# Patient Record
Sex: Female | Born: 1950 | Race: Black or African American | Hispanic: No | Marital: Married | Smoking: Former smoker
Health system: Southern US, Community
[De-identification: ages and names within clinical notes are randomized; demographics above are authoritative.]

## PROBLEM LIST (undated history)

## (undated) DIAGNOSIS — I10 Essential (primary) hypertension: Secondary | ICD-10-CM

## (undated) DIAGNOSIS — Z923 Personal history of irradiation: Secondary | ICD-10-CM

## (undated) DIAGNOSIS — I2510 Atherosclerotic heart disease of native coronary artery without angina pectoris: Secondary | ICD-10-CM

## (undated) DIAGNOSIS — E785 Hyperlipidemia, unspecified: Secondary | ICD-10-CM

## (undated) DIAGNOSIS — C50919 Malignant neoplasm of unspecified site of unspecified female breast: Secondary | ICD-10-CM

## (undated) DIAGNOSIS — I219 Acute myocardial infarction, unspecified: Secondary | ICD-10-CM

## (undated) DIAGNOSIS — E119 Type 2 diabetes mellitus without complications: Secondary | ICD-10-CM

## (undated) HISTORY — DX: Atherosclerotic heart disease of native coronary artery without angina pectoris: I25.10

## (undated) HISTORY — PX: CORONARY ARTERY BYPASS GRAFT: SHX141

## (undated) HISTORY — PX: ABDOMINAL HYSTERECTOMY: SHX81

## (undated) HISTORY — DX: Hyperlipidemia, unspecified: E78.5

## (undated) HISTORY — PX: BREAST LUMPECTOMY: SHX2

---

## 2008-08-16 ENCOUNTER — Ambulatory Visit: Payer: Self-pay | Admitting: Family Medicine

## 2008-08-24 ENCOUNTER — Ambulatory Visit: Payer: Self-pay | Admitting: Internal Medicine

## 2008-08-26 ENCOUNTER — Ambulatory Visit: Payer: Self-pay | Admitting: Family Medicine

## 2008-08-31 ENCOUNTER — Ambulatory Visit: Payer: Self-pay | Admitting: Internal Medicine

## 2008-11-03 ENCOUNTER — Encounter: Payer: Self-pay | Admitting: Internal Medicine

## 2008-11-27 ENCOUNTER — Encounter: Payer: Self-pay | Admitting: Internal Medicine

## 2009-04-29 DIAGNOSIS — C50919 Malignant neoplasm of unspecified site of unspecified female breast: Secondary | ICD-10-CM

## 2009-04-29 DIAGNOSIS — Z923 Personal history of irradiation: Secondary | ICD-10-CM

## 2009-04-29 HISTORY — DX: Malignant neoplasm of unspecified site of unspecified female breast: C50.919

## 2009-04-29 HISTORY — DX: Personal history of irradiation: Z92.3

## 2009-04-29 HISTORY — PX: BREAST BIOPSY: SHX20

## 2009-10-11 ENCOUNTER — Ambulatory Visit: Payer: Self-pay | Admitting: Family Medicine

## 2009-12-15 ENCOUNTER — Ambulatory Visit: Payer: Self-pay | Admitting: Gastroenterology

## 2009-12-19 LAB — PATHOLOGY REPORT

## 2010-01-25 ENCOUNTER — Ambulatory Visit: Payer: Self-pay | Admitting: Emergency Medicine

## 2010-01-30 ENCOUNTER — Ambulatory Visit: Payer: Self-pay | Admitting: Emergency Medicine

## 2010-02-02 LAB — PATHOLOGY REPORT

## 2010-02-08 ENCOUNTER — Ambulatory Visit: Payer: Self-pay | Admitting: Emergency Medicine

## 2010-02-13 LAB — PATHOLOGY REPORT

## 2010-02-27 ENCOUNTER — Ambulatory Visit: Payer: Self-pay | Admitting: Radiation Oncology

## 2010-02-28 ENCOUNTER — Ambulatory Visit: Payer: Self-pay | Admitting: Radiation Oncology

## 2010-03-26 ENCOUNTER — Encounter: Payer: Self-pay | Admitting: Family Medicine

## 2010-03-29 ENCOUNTER — Ambulatory Visit: Payer: Self-pay | Admitting: Radiation Oncology

## 2010-03-29 ENCOUNTER — Encounter: Payer: Self-pay | Admitting: Family Medicine

## 2010-04-29 ENCOUNTER — Ambulatory Visit: Payer: Self-pay | Admitting: Radiation Oncology

## 2010-04-29 ENCOUNTER — Encounter: Payer: Self-pay | Admitting: Family Medicine

## 2010-05-30 ENCOUNTER — Ambulatory Visit: Payer: Self-pay | Admitting: Radiation Oncology

## 2010-06-28 ENCOUNTER — Ambulatory Visit: Payer: Self-pay | Admitting: Internal Medicine

## 2010-08-06 ENCOUNTER — Ambulatory Visit: Payer: Self-pay | Admitting: Internal Medicine

## 2010-08-08 ENCOUNTER — Ambulatory Visit: Payer: Self-pay | Admitting: Radiation Oncology

## 2010-08-22 ENCOUNTER — Ambulatory Visit: Payer: Self-pay | Admitting: Family Medicine

## 2010-08-28 ENCOUNTER — Ambulatory Visit: Payer: Self-pay | Admitting: Internal Medicine

## 2010-09-28 ENCOUNTER — Ambulatory Visit: Payer: Self-pay | Admitting: Internal Medicine

## 2011-04-04 ENCOUNTER — Ambulatory Visit: Payer: Self-pay | Admitting: Internal Medicine

## 2011-04-30 ENCOUNTER — Ambulatory Visit: Payer: Self-pay | Admitting: Internal Medicine

## 2011-07-22 IMAGING — CT CT SIM MISC
1 series · 16 of 33 positions shown, 20 images · non-contrast
Comparison: none

[Series 2: — · axial · 1.17mm/px · z∈[-934,-622]mm · 16 of 114 slices shown, 20 images]
[im 5/114  mediastinal]
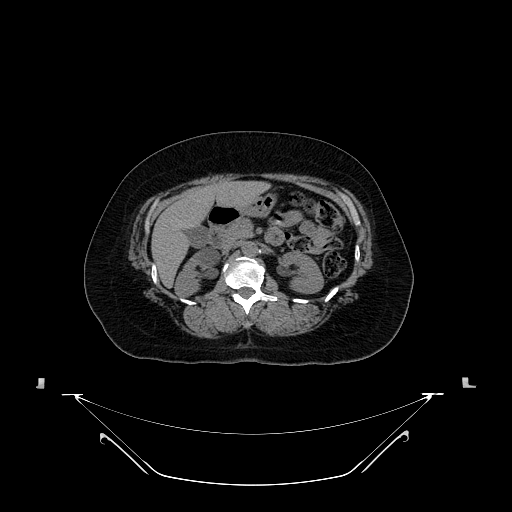
[im 5/114  lung]
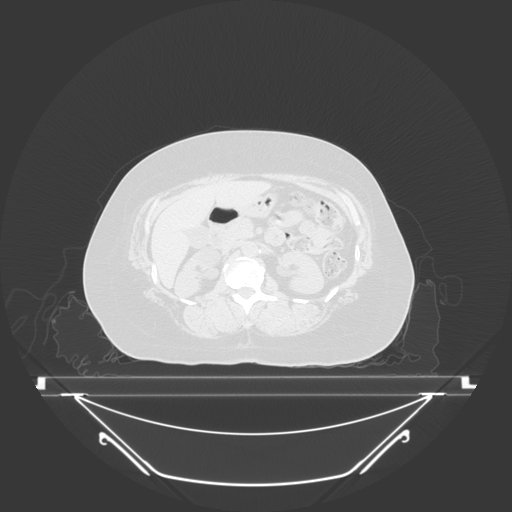
[im 13/114  lung]
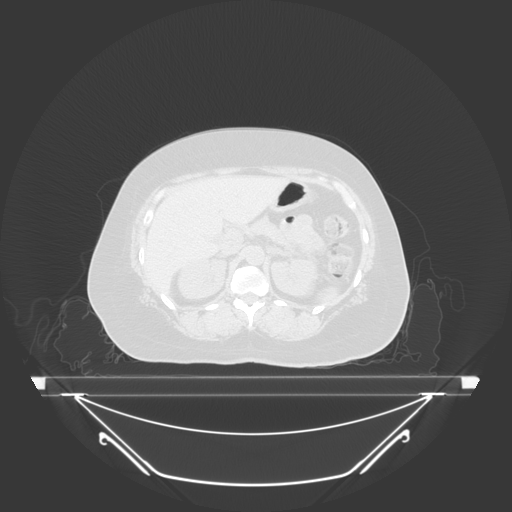
[im 21/114  lung]
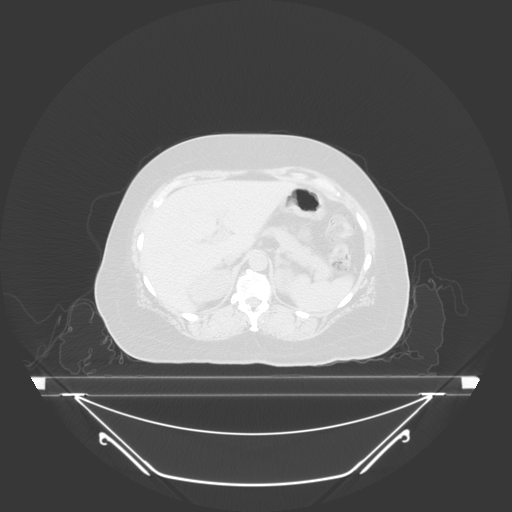
[im 26/114  lung]
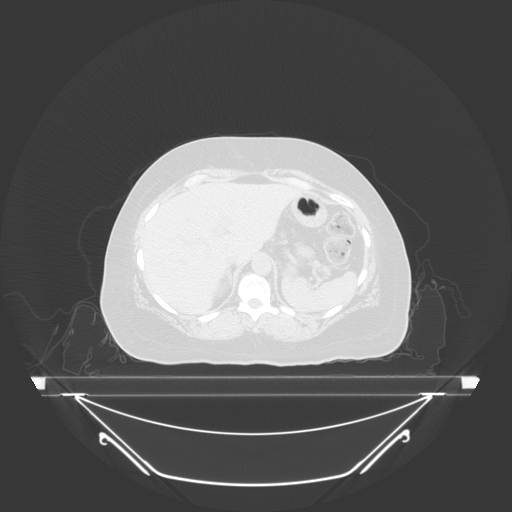
[im 34/114  mediastinal]
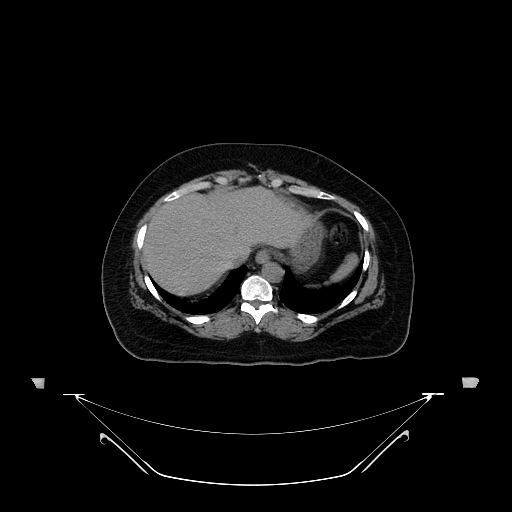
[im 34/114  lung]
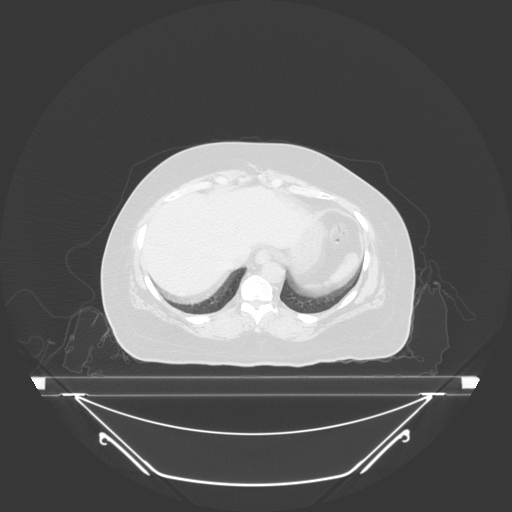
[im 42/114  lung]
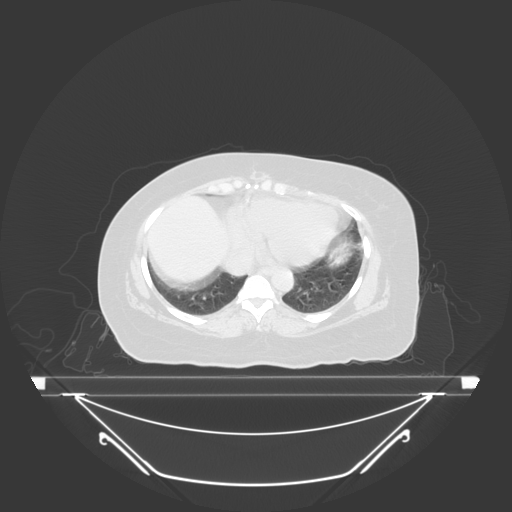
[im 47/114  lung]
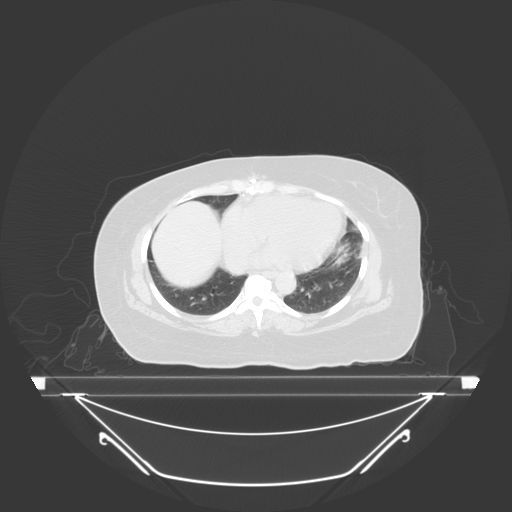
[im 55/114  lung]
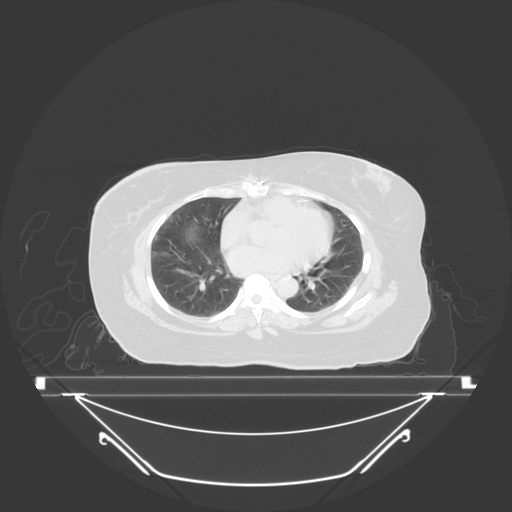
[im 63/114  mediastinal]
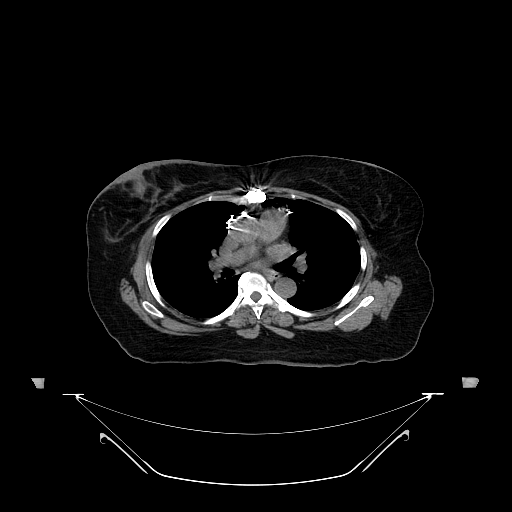
[im 63/114  lung]
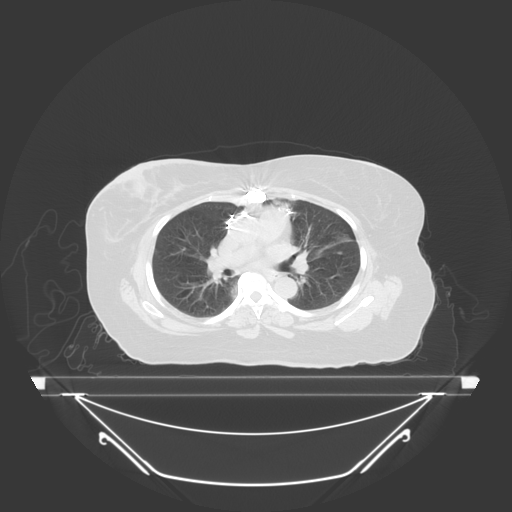
[im 67/114  lung]
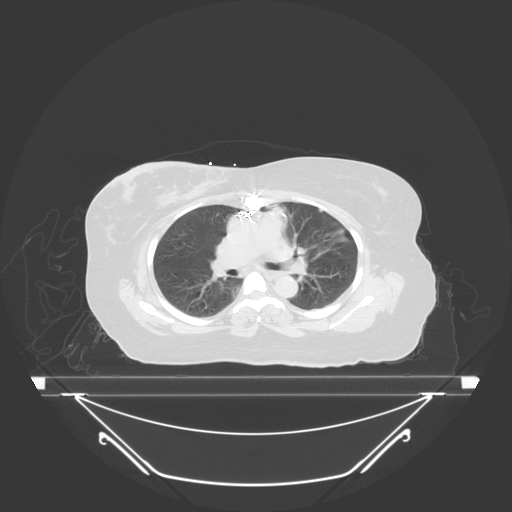
[im 72/114  lung]
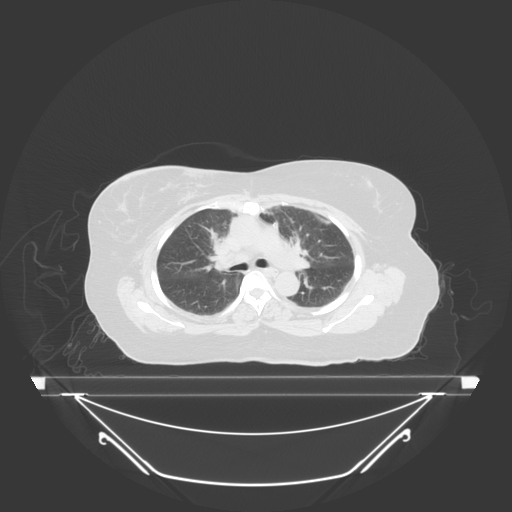
[im 80/114  lung]
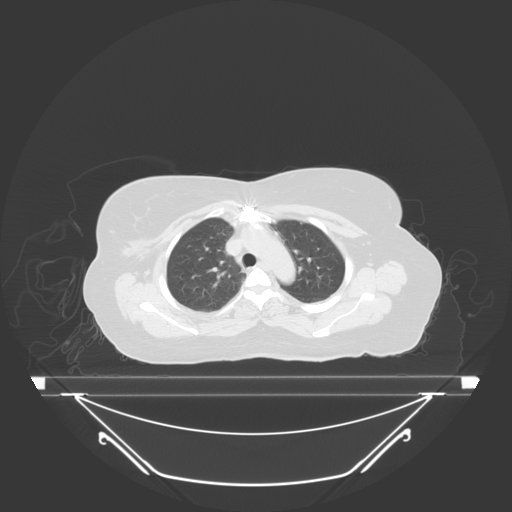
[im 88/114  mediastinal]
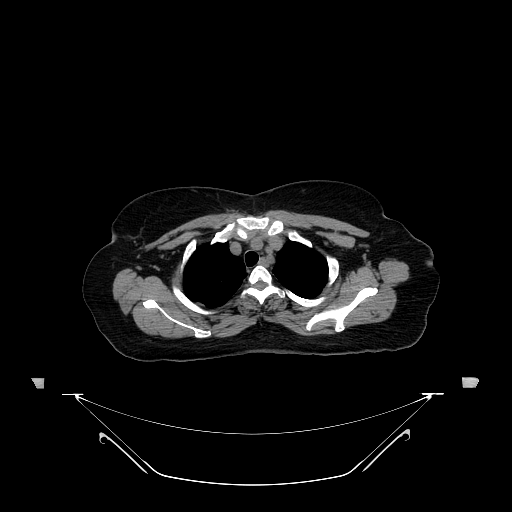
[im 88/114  lung]
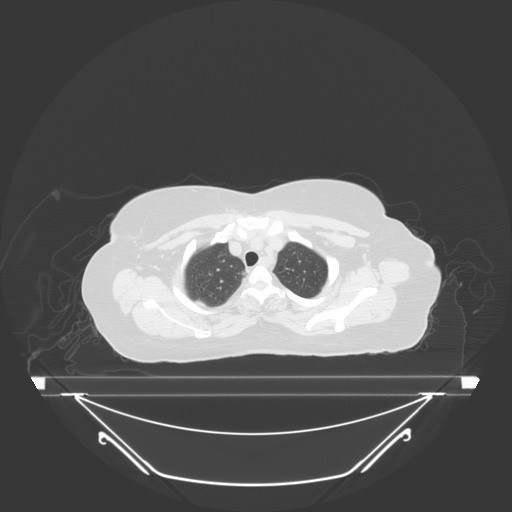
[im 93/114  lung]
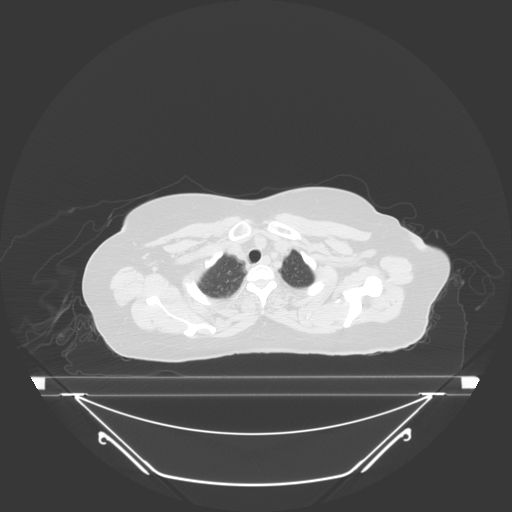
[im 101/114  lung]
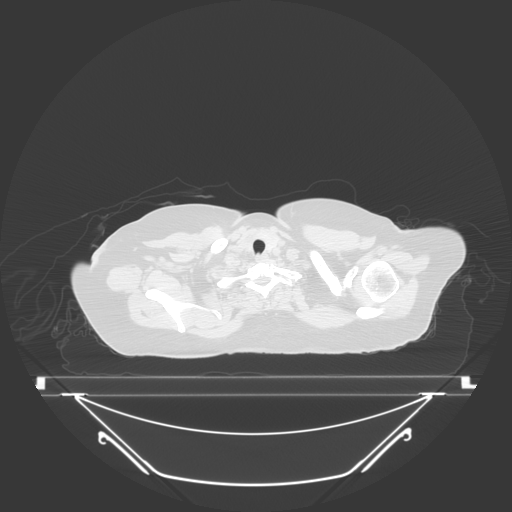
[im 109/114  lung]
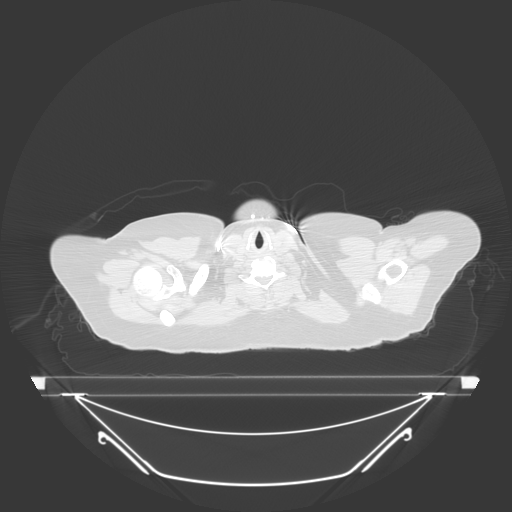

[16 of 33 positions shown; findings below may reference images not displayed]

IMAGES IMPORTED FROM THE [employer] WORKFLOW SYSTEM
NO DICTATION FOR STUDY

## 2011-08-12 ENCOUNTER — Ambulatory Visit: Payer: Self-pay | Admitting: Internal Medicine

## 2011-11-06 ENCOUNTER — Ambulatory Visit: Payer: Self-pay | Admitting: Internal Medicine

## 2011-11-06 LAB — HEPATIC FUNCTION PANEL A (ARMC)
Albumin: 4 g/dL (ref 3.4–5.0)
Alkaline Phosphatase: 92 U/L (ref 50–136)
Bilirubin,Total: 0.2 mg/dL (ref 0.2–1.0)
SGOT(AST): 15 U/L (ref 15–37)
SGPT (ALT): 18 U/L
Total Protein: 8 g/dL (ref 6.4–8.2)

## 2011-11-06 LAB — CBC CANCER CENTER
Basophil %: 0.6 %
Eosinophil #: 0.5 x10 3/mm (ref 0.0–0.7)
Eosinophil %: 8.1 %
HCT: 38.1 % (ref 35.0–47.0)
HGB: 12.2 g/dL (ref 12.0–16.0)
Lymphocyte #: 1.2 x10 3/mm (ref 1.0–3.6)
Lymphocyte %: 20 %
MCH: 29.5 pg (ref 26.0–34.0)
Monocyte #: 0.4 x10 3/mm (ref 0.2–0.9)
Monocyte %: 7.4 %
Neutrophil #: 3.9 x10 3/mm (ref 1.4–6.5)
Neutrophil %: 63.9 %
RBC: 4.14 10*6/uL (ref 3.80–5.20)

## 2011-11-06 LAB — CREATININE, SERUM
EGFR (African American): >60
EGFR (Non-African Amer.): >60

## 2011-11-28 ENCOUNTER — Ambulatory Visit: Payer: Self-pay | Admitting: Internal Medicine

## 2012-04-02 ENCOUNTER — Ambulatory Visit: Payer: Self-pay | Admitting: Radiation Oncology

## 2012-04-29 ENCOUNTER — Ambulatory Visit: Payer: Self-pay | Admitting: Radiation Oncology

## 2012-06-27 ENCOUNTER — Ambulatory Visit: Payer: Self-pay | Admitting: Internal Medicine

## 2012-07-06 LAB — CBC CANCER CENTER
Basophil %: 0.7 %
Eosinophil %: 7 %
HCT: 36.7 % (ref 35.0–47.0)
HGB: 12.3 g/dL (ref 12.0–16.0)
Lymphocyte #: 1.4 x10 3/mm (ref 1.0–3.6)
MCH: 29.7 pg (ref 26.0–34.0)
MCHC: 33.4 g/dL (ref 32.0–36.0)
MCV: 89 fL (ref 80–100)
Neutrophil #: 3.6 x10 3/mm (ref 1.4–6.5)
Platelet: 173 x10 3/mm (ref 150–440)
RBC: 4.13 10*6/uL (ref 3.80–5.20)
RDW: 14.7 % — ABNORMAL HIGH (ref 11.5–14.5)
WBC: 5.9 x10 3/mm (ref 3.6–11.0)

## 2012-07-06 LAB — HEPATIC FUNCTION PANEL A (ARMC)
Albumin: 3.9 g/dL (ref 3.4–5.0)
Bilirubin,Total: 0.2 mg/dL (ref 0.2–1.0)
SGOT(AST): 17 U/L (ref 15–37)
SGPT (ALT): 22 U/L (ref 12–78)

## 2012-07-06 LAB — CREATININE, SERUM
Creatinine: 0.76 mg/dL (ref 0.60–1.30)
EGFR (Non-African Amer.): >60

## 2012-07-28 ENCOUNTER — Ambulatory Visit: Payer: Self-pay | Admitting: Internal Medicine

## 2012-08-26 ENCOUNTER — Ambulatory Visit: Payer: Self-pay

## 2012-09-07 ENCOUNTER — Ambulatory Visit: Payer: Self-pay

## 2013-04-02 ENCOUNTER — Ambulatory Visit: Payer: Self-pay | Admitting: Internal Medicine

## 2013-04-29 ENCOUNTER — Ambulatory Visit: Payer: Self-pay | Admitting: Internal Medicine

## 2013-07-08 ENCOUNTER — Ambulatory Visit: Payer: Self-pay | Admitting: Internal Medicine

## 2013-07-09 LAB — LIPID PANEL
Cholesterol: 114 mg/dL (ref 0–200)
HDL: 43 mg/dL (ref 40–60)
LDL CHOLESTEROL, CALC: 49 mg/dL (ref 0–100)
Triglycerides: 109 mg/dL (ref 0–200)
VLDL CHOLESTEROL, CALC: 22 mg/dL (ref 5–40)

## 2013-07-09 LAB — CBC CANCER CENTER
Basophil #: 0 x10 3/mm (ref 0.0–0.1)
Basophil %: 0.7 %
EOS PCT: 3.9 %
Eosinophil #: 0.2 x10 3/mm (ref 0.0–0.7)
HCT: 38.7 % (ref 35.0–47.0)
HGB: 12.4 g/dL (ref 12.0–16.0)
LYMPHS ABS: 1.3 x10 3/mm (ref 1.0–3.6)
Lymphocyte %: 21.5 %
MCH: 28.6 pg (ref 26.0–34.0)
MCHC: 32.2 g/dL (ref 32.0–36.0)
MCV: 89 fL (ref 80–100)
MONO ABS: 0.4 x10 3/mm (ref 0.2–0.9)
Monocyte %: 7.4 %
Neutrophil #: 4 x10 3/mm (ref 1.4–6.5)
Neutrophil %: 66.5 %
PLATELETS: 203 x10 3/mm (ref 150–440)
RBC: 4.35 10*6/uL (ref 3.80–5.20)
RDW: 14.6 % — ABNORMAL HIGH (ref 11.5–14.5)
WBC: 6 x10 3/mm (ref 3.6–11.0)

## 2013-07-09 LAB — HEPATIC FUNCTION PANEL A (ARMC)
ALBUMIN: 4 g/dL (ref 3.4–5.0)
Alkaline Phosphatase: 65 U/L
BILIRUBIN TOTAL: 0.4 mg/dL (ref 0.2–1.0)
Bilirubin, Direct: 0.1 mg/dL (ref 0.00–0.20)
SGOT(AST): 20 U/L (ref 15–37)
SGPT (ALT): 21 U/L (ref 12–78)
Total Protein: 8 g/dL (ref 6.4–8.2)

## 2013-07-09 LAB — CREATININE, SERUM: CREATININE: 0.91 mg/dL (ref 0.60–1.30)

## 2013-07-28 ENCOUNTER — Ambulatory Visit: Payer: Self-pay | Admitting: Internal Medicine

## 2013-08-18 ENCOUNTER — Ambulatory Visit: Payer: Self-pay

## 2013-08-27 ENCOUNTER — Ambulatory Visit: Payer: Self-pay | Admitting: Internal Medicine

## 2013-09-30 ENCOUNTER — Ambulatory Visit: Payer: Self-pay | Admitting: Internal Medicine

## 2014-04-07 ENCOUNTER — Ambulatory Visit: Payer: Self-pay | Admitting: Internal Medicine

## 2014-04-29 ENCOUNTER — Ambulatory Visit: Payer: Self-pay | Admitting: Internal Medicine

## 2014-07-08 ENCOUNTER — Ambulatory Visit
Admit: 2014-07-08 | Disposition: A | Discharge status: Home / Self Care | Payer: Self-pay | Attending: Internal Medicine | Admitting: Internal Medicine

## 2014-07-29 ENCOUNTER — Ambulatory Visit
Admit: 2014-07-29 | Disposition: A | Discharge status: Home / Self Care | Payer: Self-pay | Attending: Internal Medicine | Admitting: Internal Medicine

## 2014-08-22 ENCOUNTER — Ambulatory Visit
Admit: 2014-08-22 | Disposition: A | Discharge status: Home / Self Care | Payer: Self-pay | Attending: Urgent Care | Admitting: Urgent Care

## 2015-04-14 ENCOUNTER — Encounter: Payer: Self-pay | Admitting: *Deleted

## 2015-04-14 ENCOUNTER — Ambulatory Visit
Admission: RE | Admit: 2015-04-14 | Discharge: 2015-04-14 | Disposition: A | Discharge status: Home / Self Care | Payer: Self-pay | Source: Ambulatory Visit | Attending: Radiation Oncology | Admitting: Radiation Oncology

## 2015-04-14 VITALS — BP 148/80 | HR 66 | Resp 20 | Ht 66.0 in | Wt 188.3 lb

## 2015-04-14 DIAGNOSIS — Z853 Personal history of malignant neoplasm of breast: Secondary | ICD-10-CM

## 2015-04-14 HISTORY — DX: Essential (primary) hypertension: I10

## 2015-04-14 HISTORY — DX: Malignant neoplasm of unspecified site of unspecified female breast: C50.919

## 2015-04-14 HISTORY — DX: Type 2 diabetes mellitus without complications: E11.9

## 2015-04-14 HISTORY — DX: Acute myocardial infarction, unspecified: I21.9

## 2015-04-14 NOTE — Progress Notes (Signed)
Radiation Oncology Follow up Note  Name: Molly Mosley   Date:   04/14/2015 MRN:  [id number] DOB: [birth year]    This 64 y.o. female presents to the clinic today for follow-up for breast cancer stage I (T1 CN 0) positive PR negative HER-2/neu not overexpressed status post whole breast radiation and wide local excision and sentinel node biopsy.  REFERRING PROVIDER: No ref. provider found  HPI: Patient is a 64 year old female now out 4 years having completed radiation therapy to her right breast status post wide local excision for a 1.2 cm invasive mammary carcinoma ER positive PR negative HER-2/neu negative. Seen today in routine follow-up she is doing well. She specifically denies breast tenderness cough or bone pain. Does occasionally has some tenderness in her lumpectomy site. She currently is on anastrozole tolerating that well without side effect. Follow-up mammograms have been benign last one being in March 2016.Marland Kitchen  COMPLICATIONS OF TREATMENT: none  FOLLOW UP COMPLIANCE: keeps appointments   PHYSICAL EXAM:  BP 148/80 mmHg  Pulse 66  Resp 20  Ht _0  (1.676 m)  Wt 188 lb 4.4 oz (85.4 kg)  BMI 30.40 kg/m2 Lungs are clear to A&P cardiac examination essentially unremarkable with regular rate and rhythm. No dominant mass or nodularity is noted in either breast in 2 positions examined. Incision is well-healed. No axillary or supraclavicular adenopathy is appreciated. Cosmetic result is excellent. Well-developed well-nourished patient in NAD. HEENT reveals PERLA, EOMI, discs not visualized.  Oral cavity is clear. No oral mucosal lesions are identified. Neck is clear without evidence of cervical or supraclavicular adenopathy. Lungs are clear to A&P. Cardiac examination is essentially unremarkable with regular rate and rhythm without murmur rub or thrill. Abdomen is benign with no organomegaly or masses noted. Motor sensory and DTR levels are equal and symmetric in the upper and lower extremities.  Cranial nerves II through XII are grossly intact. Proprioception is intact. No peripheral adenopathy or edema is identified. No motor or sensory levels are noted. Crude visual fields are within normal range.  RADIOLOGY RESULTS: Mammograms are reviewed  PLAN: At the present time she continues to do well with no evidence of disease. I'll see her back in 1 year for follow-up. Should she be doing well at that time will discontinue follow-up care. Patient knows to call sooner with any concerns. She continues on anti-estrogen therapy without side effect.  I would like to take this opportunity for allowing me to participate in the care of your patient.Armstead Peaks., MD

## 2015-07-11 ENCOUNTER — Other Ambulatory Visit: Payer: Self-pay | Admitting: *Deleted

## 2015-07-11 DIAGNOSIS — C50911 Malignant neoplasm of unspecified site of right female breast: Secondary | ICD-10-CM

## 2015-07-12 ENCOUNTER — Inpatient Hospital Stay: Payer: Self-pay | Attending: Internal Medicine | Admitting: Internal Medicine

## 2015-07-12 ENCOUNTER — Encounter: Payer: Self-pay | Admitting: Internal Medicine

## 2015-07-12 ENCOUNTER — Inpatient Hospital Stay: Payer: Self-pay

## 2015-07-12 VITALS — BP 143/78 | HR 88 | Temp 97.7°F | Resp 18 | Ht 66.0 in | Wt 184.5 lb

## 2015-07-12 DIAGNOSIS — I2510 Atherosclerotic heart disease of native coronary artery without angina pectoris: Secondary | ICD-10-CM | POA: Insufficient documentation

## 2015-07-12 DIAGNOSIS — Z170 Estrogen receptor positive status [ER+]: Secondary | ICD-10-CM | POA: Insufficient documentation

## 2015-07-12 DIAGNOSIS — C50911 Malignant neoplasm of unspecified site of right female breast: Secondary | ICD-10-CM

## 2015-07-12 DIAGNOSIS — E785 Hyperlipidemia, unspecified: Secondary | ICD-10-CM | POA: Insufficient documentation

## 2015-07-12 DIAGNOSIS — Z7982 Long term (current) use of aspirin: Secondary | ICD-10-CM | POA: Insufficient documentation

## 2015-07-12 DIAGNOSIS — Z79811 Long term (current) use of aromatase inhibitors: Secondary | ICD-10-CM | POA: Insufficient documentation

## 2015-07-12 DIAGNOSIS — Z951 Presence of aortocoronary bypass graft: Secondary | ICD-10-CM | POA: Insufficient documentation

## 2015-07-12 DIAGNOSIS — M8580 Other specified disorders of bone density and structure, unspecified site: Secondary | ICD-10-CM | POA: Insufficient documentation

## 2015-07-12 DIAGNOSIS — I252 Old myocardial infarction: Secondary | ICD-10-CM | POA: Insufficient documentation

## 2015-07-12 DIAGNOSIS — E119 Type 2 diabetes mellitus without complications: Secondary | ICD-10-CM | POA: Insufficient documentation

## 2015-07-12 DIAGNOSIS — Z87891 Personal history of nicotine dependence: Secondary | ICD-10-CM | POA: Insufficient documentation

## 2015-07-12 DIAGNOSIS — Z79899 Other long term (current) drug therapy: Secondary | ICD-10-CM | POA: Insufficient documentation

## 2015-07-12 DIAGNOSIS — Z7984 Long term (current) use of oral hypoglycemic drugs: Secondary | ICD-10-CM | POA: Insufficient documentation

## 2015-07-12 DIAGNOSIS — I10 Essential (primary) hypertension: Secondary | ICD-10-CM | POA: Insufficient documentation

## 2015-07-12 LAB — COMPREHENSIVE METABOLIC PANEL
ALBUMIN: 4.4 g/dL (ref 3.5–5.0)
ALT: 14 U/L (ref 14–54)
AST: 21 U/L (ref 15–41)
Alkaline Phosphatase: 52 U/L (ref 38–126)
Anion gap: 7 (ref 5–15)
BUN: 15 mg/dL (ref 6–20)
CHLORIDE: 105 mmol/L (ref 101–111)
CO2: 25 mmol/L (ref 22–32)
CREATININE: 0.71 mg/dL (ref 0.44–1.00)
Calcium: 9.3 mg/dL (ref 8.9–10.3)
GFR calc Af Amer: >60 mL/min (ref >60)
GLUCOSE: 113 mg/dL — AB (ref 65–99)
POTASSIUM: 3.9 mmol/L (ref 3.5–5.1)
Sodium: 137 mmol/L (ref 135–145)
Total Bilirubin: 0.4 mg/dL (ref 0.3–1.2)
Total Protein: 7.9 g/dL (ref 6.5–8.1)

## 2015-07-12 LAB — CBC WITH DIFFERENTIAL/PLATELET
BASOS ABS: 0 10*3/uL (ref 0–0.1)
BASOS PCT: 1 %
EOS PCT: 6 %
Eosinophils Absolute: 0.3 10*3/uL (ref 0–0.7)
HEMATOCRIT: 38.9 % (ref 35.0–47.0)
Hemoglobin: 13 g/dL (ref 12.0–16.0)
LYMPHS PCT: 25 %
Lymphs Abs: 1.3 10*3/uL (ref 1.0–3.6)
MCH: 29.5 pg (ref 26.0–34.0)
MCHC: 33.5 g/dL (ref 32.0–36.0)
MCV: 88 fL (ref 80.0–100.0)
Monocytes Absolute: 0.4 10*3/uL (ref 0.2–0.9)
Monocytes Relative: 7 %
NEUTROS ABS: 3.2 10*3/uL (ref 1.4–6.5)
Neutrophils Relative %: 61 %
PLATELETS: 159 10*3/uL (ref 150–440)
RBC: 4.42 MIL/uL (ref 3.80–5.20)
RDW: 13.9 % (ref 11.5–14.5)
WBC: 5.2 10*3/uL (ref 3.6–11.0)

## 2015-07-12 MED ORDER — ANASTROZOLE 1 MG PO TABS: 1.0000 mg | ORAL_TABLET | Freq: Every day | ORAL | Status: DC

## 2015-07-12 NOTE — Progress Notes (Signed)
Pt here and she should be on last year of AI-she started it 05/19/2010. She is tolerating med without any side effects. She does her own self breast exam and felt nothing abnl. Her last mammogram was 08/22/14 and will need one ordered for April of this year. No concerns today

## 2015-07-12 NOTE — Progress Notes (Signed)
[facility] OFFICE PROGRESS NOTE  Patient Care Team: Arlis Porta, FNP as PCP - General (Nurse Practitioner)   SUMMARY OF ONCOLOGIC HISTORY:  # RIGHT BREAST CANCER- STAGE I; ER-pos/PR neg; Her 2 Neg s/p Lumpec  S/p RT; Oncotype- 23/ intermediate risk [risk of recurrence 14%]-No adj chemo; Currently on Arimidex since January 2012; march 2017- cont Arimidex  # BMD- June 2015- osteopenia  INTERVAL HISTORY:  This is my first interaction with the patient since I joined the practice September 2016. I reviewed the patient's prior charts/pertinent labs/imaging in detail; findings are summarized above.   65 year female patient with above history of stage I breast cancer on Arimidex is here for follow-up. Patient denies any unusual bone pain. Denies any unusual hot flashes. Appetite is good. No shortness of breath or chest pain or cough. She is interested in coming off anastrozole at this time.   REVIEW OF SYSTEMS:  A complete 10 point review of system is done which is negative except mentioned above/history of present illness.     PAST MEDICAL HISTORY :  Past Medical History  Diagnosis Date  . MI (myocardial infarction) ([place])   . HTN (hypertension)   . Diabetes mellitus without complication ([place])   . Breast cancer ([place])     Right  . Hyperlipidemia   . CAD (coronary artery disease)     PAST SURGICAL HISTORY :   Past Surgical History  Procedure Laterality Date  . Abdominal hysterectomy    . Coronary artery bypass graft    . Breast lumpectomy Right     FAMILY HISTORY :   Family History  Problem Relation Age of Onset  . Diabetes Mother   . Hypertension Father     SOCIAL HISTORY:   Social History  Substance Use Topics  . Smoking status: Former Smoker    Quit date: 04/30/2011  . Smokeless tobacco: Never Used  . Alcohol Use: No    ALLERGIES:  has No Known Allergies.  MEDICATIONS:  Current Outpatient Prescriptions  Medication Sig Dispense Refill  .  alendronate (FOSAMAX) 70 MG tablet     . amLODipine (NORVASC) 5 MG tablet     . anastrozole (ARIMIDEX) 1 MG tablet     . aspirin EC 325 MG tablet Take by mouth.    . carvedilol (COREG) 12.5 MG tablet     . metFORMIN (GLUMETZA) 500 MG (MOD) 24 hr tablet Take by mouth.    . quinapril (ACCUPRIL) 40 MG tablet     . rosuvastatin (CRESTOR) 40 MG tablet Take by mouth.     No current facility-administered medications for this visit.    PHYSICAL EXAMINATION: ECOG PERFORMANCE STATUS: 0 - Asymptomatic  BP 143/78 mmHg  Pulse 88  Temp(Src) 97.7 F (36.5 C) (Tympanic)  Resp 18  Ht 5\' 6"  (1.676 m)  Wt 184 lb 8.4 oz (83.7 kg)  BMI 29.80 kg/m2  Filed Weights   07/12/15 1043  Weight: 184 lb 8.4 oz (83.7 kg)    GENERAL: Well-nourished well-developed; Alert, no distress and comfortable.   Alone. EYES: no pallor or icterus OROPHARYNX: no thrush or ulceration; good dentition  NECK: supple, no masses felt LYMPH:  no palpable lymphadenopathy in the cervical, axillary or inguinal regions LUNGS: clear to auscultation and  No wheeze or crackles HEART/CVS: regular rate & rhythm and no murmurs; No lower extremity edema ABDOMEN:abdomen soft, non-tender and normal bowel sounds Musculoskeletal:no cyanosis of digits and no clubbing  PSYCH: alert & oriented x 3  with fluent speech NEURO: no focal motor/sensory deficits SKIN:  no rashes or significant lesions Right and left BREAST exam [in the presence of nurse practioner ]- no unusual skin changes or dominant masses felt. Surgical scars noted.    LABORATORY DATA:  I have reviewed the data as listed    Component Value Date/Time   NA 137 07/12/2015 1016   K 3.9 07/12/2015 1016   CL 105 07/12/2015 1016   CO2 25 07/12/2015 1016   GLUCOSE 113* 07/12/2015 1016   BUN 15 07/12/2015 1016   CREATININE 0.71 07/12/2015 1016   CREATININE 0.91 07/09/2013 0951   CALCIUM 9.3 07/12/2015 1016   PROT 7.9 07/12/2015 1016   PROT 8.0 07/09/2013 0951   ALBUMIN 4.4  07/12/2015 1016   ALBUMIN 4.0 07/09/2013 0951   AST 21 07/12/2015 1016   AST 20 07/09/2013 0951   ALT 14 07/12/2015 1016   ALT 21 07/09/2013 0951   ALKPHOS 52 07/12/2015 1016   ALKPHOS 65 07/09/2013 0951   BILITOT 0.4 07/12/2015 1016   BILITOT 0.4 07/09/2013 0951   GFRNONAA >60 07/12/2015 1016   GFRNONAA >60 07/09/2013 0951   GFRAA >60 07/12/2015 1016   GFRAA >60 07/09/2013 0951    No results found for: SPEP, UPEP  Lab Results  Component Value Date   WBC 5.2 07/12/2015   NEUTROABS 3.2 07/12/2015   HGB 13.0 07/12/2015   HCT 38.9 07/12/2015   MCV 88.0 07/12/2015   PLT 159 07/12/2015      Chemistry      Component Value Date/Time   NA 137 07/12/2015 1016   K 3.9 07/12/2015 1016   CL 105 07/12/2015 1016   CO2 25 07/12/2015 1016   BUN 15 07/12/2015 1016   CREATININE 0.71 07/12/2015 1016   CREATININE 0.91 07/09/2013 0951      Component Value Date/Time   CALCIUM 9.3 07/12/2015 1016   ALKPHOS 52 07/12/2015 1016   ALKPHOS 65 07/09/2013 0951   AST 21 07/12/2015 1016   AST 20 07/09/2013 0951   ALT 14 07/12/2015 1016   ALT 21 07/09/2013 0951   BILITOT 0.4 07/12/2015 1016   BILITOT 0.4 07/09/2013 0951        ASSESSMENT & PLAN:   # BREAST CANCER- STAGE I; ER-pos/PR neg; Her 2 Neg' Intermediate risk on oncotype- no adjuvant chemo given.  Currently on Arimidex since January 2012. Clinically no evidence of recurrence noted.Recent mammogram in April 2016 within normal limits. I discussed the recent data of extended antihormone therapy to 10 years. Patient is interested in continuing beyond 5 years at this time especially as she is tolerating aromatase inhibitors well. I would also check of breast cancer index. New prescription for anastrozole sent to the pharmacy.  # Osteopenia bone density June 2015- patient Fosamax And vitamin D. We'll repeat a bone density in June 2017.  # Patient follow-up with me with CBC CMP one year.  Patient is due for mammogram again in April 2017.      # 25 minutes face-to-face with the patient discussing the above plan of care; more than 50% of time spent on prognosis/ natural history; counseling and coordination.  Cammie Sickle, MD 07/12/2015 11:23 AM

## 2015-08-23 ENCOUNTER — Other Ambulatory Visit: Payer: Self-pay | Admitting: Internal Medicine

## 2015-08-23 ENCOUNTER — Ambulatory Visit
Admission: RE | Admit: 2015-08-23 | Discharge: 2015-08-23 | Disposition: A | Discharge status: Home / Self Care | Payer: Self-pay | Source: Ambulatory Visit | Attending: Internal Medicine | Admitting: Internal Medicine

## 2015-08-23 DIAGNOSIS — Z853 Personal history of malignant neoplasm of breast: Secondary | ICD-10-CM | POA: Insufficient documentation

## 2015-08-23 DIAGNOSIS — C50911 Malignant neoplasm of unspecified site of right female breast: Secondary | ICD-10-CM

## 2015-08-23 DIAGNOSIS — Z13820 Encounter for screening for osteoporosis: Secondary | ICD-10-CM | POA: Insufficient documentation

## 2015-08-23 DIAGNOSIS — M8580 Other specified disorders of bone density and structure, unspecified site: Secondary | ICD-10-CM | POA: Insufficient documentation

## 2015-08-24 ENCOUNTER — Telehealth: Payer: Self-pay | Admitting: *Deleted

## 2015-08-24 NOTE — Telephone Encounter (Signed)
-----   Message from Cammie Sickle, MD sent at 08/24/2015  8:45 AM EDT ----- Please inform pt that her BMD shows osteopenia- similar to previous BMD- continue ca + vitD + fosomax. However, if she is interested further, she can come to office to see me; and I can talk to her re: reclast/prolia q 6 months- Thx

## 2015-08-24 NOTE — Telephone Encounter (Signed)
Left msg asking pt to contact cancer center for bmd results

## 2015-08-29 ENCOUNTER — Telehealth: Payer: Self-pay | Admitting: *Deleted

## 2015-08-29 NOTE — Telephone Encounter (Signed)
ASSESSMENT: The BMD measured at Femur Neck Left is 0.848 g/cm2 with a T-score of -1.4. This patient is considered osteopenic according to [facility]) criteria. Lumbar spine was not utilized due to advanced degenerative changes.  Site Region Measured Measured WHO Young Adult BMD Date Age Classification T-score DualFemur Neck Left 08/23/2015 65.0 Oste[age] -1.4 0.848 g/cm2 DualFemur Neck Left 09/30/2013 62.1 Osteopenia -1.2 0.873 g/cm2  Left Forearm Radius 33% 08/23/2015 64.0 Normal 0.0 0.879 g/cm2 Left Forearm Radius 33% 09/30/2013 62.1 Normal 0.2 0.889 g/cm2  World Health Organization [facility]) criteria for post-menopausal, Caucasian Women: Normal: T-score at or above -1 SD Osteopenia: T-score between -1 and -2.5 SD Osteoporosis: T-score at or below -2.5 SD  RECOMMENDATIONS: [facility] recommends that FDA-approved medical therapies be considered in postmenopausal women and men age 22 or older with a: 1. Hip or vertebral (clinical or morphometric) fracture. 2. T-score of < -2.5 at the spine or hip. 3. Ten-year fracture probability by FRAX of 3% or greater for hip fracture or 20% or greater for major osteoporotic fracture.  All treatment decisions require clinical judgment and consideration of individual patient factors, including patient preferences, co-morbidities, previous drug use, risk factors not captured in the FRAX model (e.g. falls, vitamin D deficiency, increased bone turnover, interval significant decline in bone density) and possible under - or over-estimation of fracture risk by FRAX.  All patients should ensure an adequate intake of dietary calcium (1200 mg/d) and vitamin D (800 IU daily) unless contraindicated.

## 2015-08-30 NOTE — Telephone Encounter (Signed)
I attempted to return call to patient and left VM to call me back   Per Dr B on testing report, "Please inform pt that her BMD shows osteopenia- similar to previous BMD- continue ca + vitD + fosomax. However, if she is interested further, she can come to office to see me; and I can talk to her re: reclast/prolia q 6 months- Thx"

## 2016-02-16 ENCOUNTER — Encounter: Payer: Self-pay | Admitting: *Deleted

## 2016-04-12 ENCOUNTER — Ambulatory Visit: Payer: Self-pay | Admitting: Radiation Oncology

## 2016-04-23 ENCOUNTER — Ambulatory Visit: Payer: Self-pay | Admitting: Radiation Oncology

## 2016-05-10 ENCOUNTER — Encounter: Payer: Self-pay | Admitting: *Deleted

## 2016-05-20 ENCOUNTER — Ambulatory Visit: Payer: Self-pay | Admitting: Radiation Oncology

## 2016-06-18 ENCOUNTER — Emergency Department
Admission: EM | Admit: 2016-06-18 | Discharge: 2016-06-18 | Disposition: A | Discharge status: Home / Self Care | Payer: Self-pay | Attending: Emergency Medicine | Admitting: Emergency Medicine

## 2016-06-18 ENCOUNTER — Emergency Department: Payer: Self-pay

## 2016-06-18 DIAGNOSIS — Z853 Personal history of malignant neoplasm of breast: Secondary | ICD-10-CM | POA: Insufficient documentation

## 2016-06-18 DIAGNOSIS — E119 Type 2 diabetes mellitus without complications: Secondary | ICD-10-CM | POA: Insufficient documentation

## 2016-06-18 DIAGNOSIS — I10 Essential (primary) hypertension: Secondary | ICD-10-CM | POA: Insufficient documentation

## 2016-06-18 DIAGNOSIS — R002 Palpitations: Secondary | ICD-10-CM | POA: Insufficient documentation

## 2016-06-18 DIAGNOSIS — Z87891 Personal history of nicotine dependence: Secondary | ICD-10-CM | POA: Insufficient documentation

## 2016-06-18 DIAGNOSIS — Z79899 Other long term (current) drug therapy: Secondary | ICD-10-CM | POA: Insufficient documentation

## 2016-06-18 DIAGNOSIS — Z7984 Long term (current) use of oral hypoglycemic drugs: Secondary | ICD-10-CM | POA: Insufficient documentation

## 2016-06-18 DIAGNOSIS — Z7982 Long term (current) use of aspirin: Secondary | ICD-10-CM | POA: Insufficient documentation

## 2016-06-18 DIAGNOSIS — J069 Acute upper respiratory infection, unspecified: Secondary | ICD-10-CM | POA: Insufficient documentation

## 2016-06-18 DIAGNOSIS — I252 Old myocardial infarction: Secondary | ICD-10-CM | POA: Insufficient documentation

## 2016-06-18 DIAGNOSIS — I2510 Atherosclerotic heart disease of native coronary artery without angina pectoris: Secondary | ICD-10-CM | POA: Insufficient documentation

## 2016-06-18 LAB — CBC
HCT: 38.2 % (ref 35.0–47.0)
HEMOGLOBIN: 13.2 g/dL (ref 12.0–16.0)
MCH: 30.3 pg (ref 26.0–34.0)
MCHC: 34.6 g/dL (ref 32.0–36.0)
MCV: 87.6 fL (ref 80.0–100.0)
PLATELETS: 176 10*3/uL (ref 150–440)
RBC: 4.36 MIL/uL (ref 3.80–5.20)
RDW: 13.9 % (ref 11.5–14.5)
WBC: 7.3 10*3/uL (ref 3.6–11.0)

## 2016-06-18 LAB — BASIC METABOLIC PANEL
ANION GAP: 9 (ref 5–15)
BUN: 13 mg/dL (ref 6–20)
CALCIUM: 10 mg/dL (ref 8.9–10.3)
CHLORIDE: 103 mmol/L (ref 101–111)
CO2: 28 mmol/L (ref 22–32)
CREATININE: 1.16 mg/dL — AB (ref 0.44–1.00)
GFR calc non Af Amer: 49 mL/min — ABNORMAL LOW (ref >60)
GFR, EST AFRICAN AMERICAN: 56 mL/min — AB (ref >60)
Glucose, Bld: 140 mg/dL — ABNORMAL HIGH (ref 65–99)
Potassium: 3.4 mmol/L — ABNORMAL LOW (ref 3.5–5.1)
SODIUM: 140 mmol/L (ref 135–145)

## 2016-06-18 LAB — TROPONIN I

## 2016-06-18 NOTE — ED Triage Notes (Signed)
Pt states she was on tamiflu for 2.5 days and on Sunday had some heart palpitations and just didn't feel well so she stopped taking the tamiflu and states the cough and congestion is not getting any better and is having some tightness in her chest still.

## 2016-06-18 NOTE — ED Provider Notes (Signed)
[facility] Emergency Department Provider Note  Time seen: 7:14 PM  I have reviewed the triage vital signs and the nursing notes.   HISTORY  Chief Complaint Palpitations    HPI Anycia Kaliszewski is a 66 y.o. female with a past medical history of CAD, diabetes, hypertension, MI status post CABG, presents to the emergency department with cough, congestion and palpitations. According to the patient proximal neck 5 days ago she developed cough and congestion. She talked to her doctor who diagnosed her with influenza clinically and started her on Tamiflu. Patient states after 2-1/2 days of Tamiflu she began having palpitations where she was feeling like her heart was skipping beats so she stopped taking it. Denies any chest pain or discomfort. States continued cough with a states the cough has decreased in severity. Patient states she continues to have congestion and feeling weak so she came to the department for evaluation.  Past Medical History:  Diagnosis Date  . Breast cancer [facility]) 2011   Right- radiation  . CAD (coronary artery disease)   . Diabetes mellitus without complication ([place])   . HTN (hypertension)   . Hyperlipidemia   . MI (myocardial infarction)     There are no active problems to display for this patient.   Past Surgical History:  Procedure Laterality Date  . ABDOMINAL HYSTERECTOMY    . BREAST BIOPSY Right 2011   Positive  . BREAST LUMPECTOMY Right   . CORONARY ARTERY BYPASS GRAFT      Prior to Admission medications   Medication Sig Start Date End Date Taking? Authorizing Provider  alendronate (FOSAMAX) 70 MG tablet  04/10/15   Historical Provider, MD  amLODipine (NORVASC) 5 MG tablet  03/08/15   Historical Provider, MD  anastrozole (ARIMIDEX) 1 MG tablet Take 1 tablet (1 mg total) by mouth daily. 07/12/15   Cammie Sickle, MD  aspirin EC 325 MG tablet Take by mouth.    Historical Provider, MD  carvedilol (COREG) 12.5 MG tablet  02/01/15    Historical Provider, MD  metFORMIN (GLUMETZA) 500 MG (MOD) 24 hr tablet Take by mouth.    Historical Provider, MD  quinapril (ACCUPRIL) 40 MG tablet  02/01/15   Historical Provider, MD  rosuvastatin (CRESTOR) 40 MG tablet Take by mouth. 02/02/15   Historical Provider, MD    No Known Allergies  Family History  Problem Relation Age of Onset  . Diabetes Mother   . Hypertension Father   . Breast cancer Neg Hx     Social History Social History  Substance Use Topics  . Smoking status: Former Smoker    Quit date: 04/30/2011  . Smokeless tobacco: Never Used  . Alcohol use No    Review of Systems Constitutional: Negative for fever. Cardiovascular: Negative for chest pain.Positive for palpitations, now resolved. Respiratory: Negative for shortness of breath. Positive for cough Gastrointestinal: Negative for abdominal pain Neurological: Negative for headache 10-point ROS otherwise negative.  ____________________________________________   PHYSICAL EXAM:  VITAL SIGNS: ED Triage Vitals  Enc Vitals Group     BP 06/18/16 1830 127/63     Pulse Rate 06/18/16 1830 (!) 55     Resp 06/18/16 1830 18     Temp 06/18/16 1830 99.1 F (37.3 C)     Temp Source 06/18/16 1830 Oral     SpO2 06/18/16 1830 98 %     Weight 06/18/16 1831 174 lb (78.9 kg)     Height 06/18/16 1831 5\' 6"  (1.676 m)  Head Circumference --      Peak Flow --      Pain Score 06/18/16 1831 0     Pain Loc --      Pain Edu? --      Excl. in [place]? --     Constitutional: Alert and oriented. Well appearing and in no distress. Eyes: Normal exam ENT   Head: Normocephalic and atraumatic.   Mouth/Throat: Mucous membranes are moist. Cardiovascular: Normal rate, regular rhythm. No murmur Respiratory: Normal respiratory effort without tachypnea nor retractions. Mild rhonchi in the right lung field. Gastrointestinal: Soft and nontender. No distention.  Musculoskeletal: Nontender with normal range of motion in all  extremities.  Neurologic:  Normal speech and language. No gross focal neurologic deficits. Skin:  Skin is warm, dry and intact.  Psychiatric: Mood and affect are normal.   ____________________________________________    EKG  EKG reviewed and interpreted by myself appears to show sinus bradycardia versus a defibrillation at 55 bpm, narrow QRS, left axis deviation, nonspecific but no concerning ST changes.  Repeat EKG reviewed and interpreted by myself 19:20:57 shows sinus bradycardia with clear P waves at 55 bpm. Narrow QRS with left axis deviation with nonspecific but no concerning ST changes.  ____________________________________________    RADIOLOGY  Chest x-ray shows mid left lung atelectasis otherwise negative  ____________________________________________   INITIAL IMPRESSION / ASSESSMENT AND PLAN / ED COURSE  Pertinent labs & imaging results that were available during my care of the patient were reviewed by me and considered in my medical decision making (see chart for details).  Patient presents to the emergency department with continued cough and congestion after being diagnosed with influenza 4 days ago. Patient is no longer taking Tamiflu as she states he was giving her palpitations 2 days ago. Denies any current palpitations but continues to have cough and congestion and generalized fatigue so she came to the emergency department for evaluation. Patient's chest x-ray is negative, labs are largely within normal limits. Troponin is negative. Patient's EKG shows bradycardia which she has been seen by Dr. Nehemiah Massed for in the past however there is no clear P waves. We will repeat an EKG to ensure sinus bradycardia versus atrial fibrillation.  Labs are within normal limits. Troponin negative. Chest x-rays negative. Repeat EKG clearly shows sinus bradycardia. Patient will be discharged home with PCP follow-up. She is agreeable to this  plan.  ____________________________________________   FINAL CLINICAL IMPRESSION(S) / ED DIAGNOSES  Palpitations Upper respiratory infection    Harvest Dark, MD 06/18/16 [phone]

## 2016-07-09 ENCOUNTER — Other Ambulatory Visit: Payer: Self-pay | Admitting: *Deleted

## 2016-07-09 DIAGNOSIS — Z853 Personal history of malignant neoplasm of breast: Secondary | ICD-10-CM

## 2016-07-11 ENCOUNTER — Encounter: Payer: Self-pay | Admitting: Radiation Oncology

## 2016-07-11 ENCOUNTER — Inpatient Hospital Stay (HOSPITAL_BASED_OUTPATIENT_CLINIC_OR_DEPARTMENT_OTHER): Payer: Medicare Other | Admitting: Internal Medicine

## 2016-07-11 ENCOUNTER — Inpatient Hospital Stay: Payer: Medicare Other | Attending: Internal Medicine

## 2016-07-11 ENCOUNTER — Ambulatory Visit
Admission: RE | Admit: 2016-07-11 | Discharge: 2016-07-11 | Disposition: A | Discharge status: Home / Self Care | Payer: Medicare Other | Source: Ambulatory Visit | Attending: Radiation Oncology | Admitting: Radiation Oncology

## 2016-07-11 VITALS — BP 144/81 | HR 65 | Temp 98.9°F | Resp 20 | Wt 177.8 lb

## 2016-07-11 DIAGNOSIS — C50811 Malignant neoplasm of overlapping sites of right female breast: Secondary | ICD-10-CM | POA: Insufficient documentation

## 2016-07-11 DIAGNOSIS — Z79811 Long term (current) use of aromatase inhibitors: Secondary | ICD-10-CM | POA: Insufficient documentation

## 2016-07-11 DIAGNOSIS — I10 Essential (primary) hypertension: Secondary | ICD-10-CM

## 2016-07-11 DIAGNOSIS — I252 Old myocardial infarction: Secondary | ICD-10-CM

## 2016-07-11 DIAGNOSIS — R232 Flushing: Secondary | ICD-10-CM

## 2016-07-11 DIAGNOSIS — M8580 Other specified disorders of bone density and structure, unspecified site: Secondary | ICD-10-CM

## 2016-07-11 DIAGNOSIS — E785 Hyperlipidemia, unspecified: Secondary | ICD-10-CM | POA: Insufficient documentation

## 2016-07-11 DIAGNOSIS — I2510 Atherosclerotic heart disease of native coronary artery without angina pectoris: Secondary | ICD-10-CM

## 2016-07-11 DIAGNOSIS — Z170 Estrogen receptor positive status [ER+]: Secondary | ICD-10-CM | POA: Insufficient documentation

## 2016-07-11 DIAGNOSIS — Z87891 Personal history of nicotine dependence: Secondary | ICD-10-CM | POA: Insufficient documentation

## 2016-07-11 DIAGNOSIS — Z79899 Other long term (current) drug therapy: Secondary | ICD-10-CM | POA: Insufficient documentation

## 2016-07-11 DIAGNOSIS — Z7982 Long term (current) use of aspirin: Secondary | ICD-10-CM | POA: Insufficient documentation

## 2016-07-11 DIAGNOSIS — Z923 Personal history of irradiation: Secondary | ICD-10-CM | POA: Insufficient documentation

## 2016-07-11 DIAGNOSIS — Z853 Personal history of malignant neoplasm of breast: Secondary | ICD-10-CM

## 2016-07-11 DIAGNOSIS — E119 Type 2 diabetes mellitus without complications: Secondary | ICD-10-CM | POA: Insufficient documentation

## 2016-07-11 DIAGNOSIS — C50911 Malignant neoplasm of unspecified site of right female breast: Secondary | ICD-10-CM | POA: Insufficient documentation

## 2016-07-11 LAB — COMPREHENSIVE METABOLIC PANEL
ALT: 13 U/L — ABNORMAL LOW (ref 14–54)
AST: 23 U/L (ref 15–41)
Albumin: 4.3 g/dL (ref 3.5–5.0)
Alkaline Phosphatase: 51 U/L (ref 38–126)
Anion gap: 7 (ref 5–15)
BILIRUBIN TOTAL: 0.5 mg/dL (ref 0.3–1.2)
BUN: 12 mg/dL (ref 6–20)
CO2: 27 mmol/L (ref 22–32)
CREATININE: 0.76 mg/dL (ref 0.44–1.00)
Calcium: 9.7 mg/dL (ref 8.9–10.3)
Chloride: 106 mmol/L (ref 101–111)
Glucose, Bld: 130 mg/dL — ABNORMAL HIGH (ref 65–99)
POTASSIUM: 3.7 mmol/L (ref 3.5–5.1)
Sodium: 140 mmol/L (ref 135–145)
TOTAL PROTEIN: 8.1 g/dL (ref 6.5–8.1)

## 2016-07-11 LAB — CBC WITH DIFFERENTIAL/PLATELET
BASOS ABS: 0 10*3/uL (ref 0–0.1)
Basophils Relative: 1 %
EOS PCT: 10 %
Eosinophils Absolute: 0.5 10*3/uL (ref 0–0.7)
HEMATOCRIT: 37.8 % (ref 35.0–47.0)
Hemoglobin: 12.7 g/dL (ref 12.0–16.0)
LYMPHS ABS: 1.3 10*3/uL (ref 1.0–3.6)
LYMPHS PCT: 25 %
MCH: 29.7 pg (ref 26.0–34.0)
MCHC: 33.7 g/dL (ref 32.0–36.0)
MCV: 88.4 fL (ref 80.0–100.0)
MONO ABS: 0.4 10*3/uL (ref 0.2–0.9)
MONOS PCT: 8 %
Neutro Abs: 3 10*3/uL (ref 1.4–6.5)
Neutrophils Relative %: 56 %
PLATELETS: 178 10*3/uL (ref 150–440)
RBC: 4.28 MIL/uL (ref 3.80–5.20)
RDW: 14.1 % (ref 11.5–14.5)
WBC: 5.3 10*3/uL (ref 3.6–11.0)

## 2016-07-11 MED ORDER — ANASTROZOLE 1 MG PO TABS: 1.0000 mg | ORAL_TABLET | Freq: Every day | ORAL | 5 refills | Status: DC

## 2016-07-11 NOTE — Progress Notes (Signed)
Patient here for breast cancer follow-up. She has no medical complaints. Patient assessment/vitals performed in radiation dept. Patient currently on Arimidex. She would like to know when md will take her off the Arimidex. She has been on the medication for approx. 6 years.  Pt request RF on Arimidex should md would like her to be on medication for a longer period of time.

## 2016-07-11 NOTE — Assessment & Plan Note (Addendum)
#   BREAST CANCER- STAGE I; ER-pos/PR neg; Her 2 Neg' Intermediate risk on oncotype- no adjuvant chemo given.  Currently on Arimidex since January 2012. Clinically no evidence of recurrence noted.Recent mammogram in April 2016 within normal limits.   # I again reviewed the recent data of extended antihormone therapy to 10 years. Patient is interested in continuing beyond 5 years at this time especially as she is tolerating aromatase inhibitors well.  I would also check of breast cancer index- if benefit is low of extended therapy- I would recommend discontinuation of AI therapy.Pt is in agreement of the plan. New prescription for anastrozole sent to the pharmacy.  # Osteopenia bone density June 2015- patient Fosamax And vitamin D. April 2017- osteopenia..  # Hot flashes- G-1-2.   # Patient follow-up with me with CBC CMP one year.  Patient is due for mammogram again in April 2018. Will call re: results of [facility]CI- and with recommendations re: continuation of arimidex.

## 2016-07-11 NOTE — Progress Notes (Signed)
Radiation Oncology Follow up Note  Name: Molly Mosley   Date:   07/11/2016 MRN:  [id number] DOB: 1951-04-25    This 66 y.o. female presents to the clinic today for 5 year follow-up status post whole breast radiation for stage I ER positive PR negative invasive mammary carcinoma to her right breast status post wide local excision.  REFERRING PROVIDER: Arlis Porta, FNP  HPI: Patient is a 66 year old female now out 5 years having completed whole breast radiation to her right breast for 1.2 cm invasive mammary carcinoma ER positive PR negative HER-2/neu not overexpressed. Seen today in routine follow she continues to do well. She specifically denies breast tenderness cough or bone pain.. She is currently on room and asked time that well without side effect. Her last mammograms back in April 2017 were BI-RADS 2 benign she scheduled for repeat scans in April.  COMPLICATIONS OF TREATMENT: none  FOLLOW UP COMPLIANCE: keeps appointments   PHYSICAL EXAM:  BP (!) 144/81   Pulse 65   Temp 98.9 F (37.2 C)   Resp 20   Wt 177 lb 12.8 oz (80.7 kg)   BMI 28.70 kg/m  Lungs are clear to A&P cardiac examination essentially unremarkable with regular rate and rhythm. No dominant mass or nodularity is noted in either breast in 2 positions examined. Incision is well-healed. No axillary or supraclavicular adenopathy is appreciated. Cosmetic result is excellent. Well-developed well-nourished patient in NAD. HEENT reveals PERLA, EOMI, discs not visualized.  Oral cavity is clear. No oral mucosal lesions are identified. Neck is clear without evidence of cervical or supraclavicular adenopathy. Lungs are clear to A&P. Cardiac examination is essentially unremarkable with regular rate and rhythm without murmur rub or thrill. Abdomen is benign with no organomegaly or masses noted. Motor sensory and DTR levels are equal and symmetric in the upper and lower extremities. Cranial nerves II through XII are grossly intact.  Proprioception is intact. No peripheral adenopathy or edema is identified. No motor or sensory levels are noted. Crude visual fields are within normal range.  RADIOLOGY RESULTS: Previous mammograms are reviewed and compatible with the above-stated findings  PLAN: Present time she is now 5 years out doing well with no evidence of disease. I'm please were overall progress. I am going to discontinue follow-up care at this point. She continues on aromatase. She continues follow-up care with medical oncology. Would be happy to reevaluate the patient any time for any issues.  I would like to take this opportunity to thank you for allowing me to participate in the care of your patient.Armstead Peaks., MD

## 2016-07-11 NOTE — Progress Notes (Signed)
[place] OFFICE PROGRESS NOTE  Patient Care Team: Frazier Richards, MD as PCP - General (Family Medicine)   SUMMARY OF ONCOLOGIC HISTORY: Oncology History   # RIGHT BREAST CANCER- STAGE I; ER-pos/PR neg; Her 2 Neg s/p Lumpec  S/p RT; Oncotype- 23/ intermediate risk [risk of recurrence 14%]-No adj chemo; Currently on Arimidex since January 2012; march 2017- cont Arimidex  # BMD- June 2015- osteopenia     Carcinoma of overlapping sites of right breast in female, estrogen receptor positive ([place])     INTERVAL HISTORY:  66 year female patient with above history of stage I breast cancer on Arimidex is here for follow-up. Patient denies any unusual bone pain. Patient has mild to moderate hot flashes. Appetite is good. No shortness of breath or chest pain or cough. Otherwise she is tolerating anastrozole fairly well.  REVIEW OF SYSTEMS:  A complete 10 point review of system is done which is negative except mentioned above/history of present illness.     PAST MEDICAL HISTORY :  Past Medical History:  Diagnosis Date  . Breast cancer [facility]) 2011   Right- radiation  . CAD (coronary artery disease)   . Diabetes mellitus without complication ([place])   . HTN (hypertension)   . Hyperlipidemia   . MI (myocardial infarction)     PAST SURGICAL HISTORY :   Past Surgical History:  Procedure Laterality Date  . ABDOMINAL HYSTERECTOMY    . BREAST BIOPSY Right 2011   Positive  . BREAST LUMPECTOMY Right   . CORONARY ARTERY BYPASS GRAFT      FAMILY HISTORY :   Family History  Problem Relation Age of Onset  . Diabetes Mother   . Hypertension Father   . Breast cancer Neg Hx     SOCIAL HISTORY:   Social History  Substance Use Topics  . Smoking status: Former Smoker    Quit date: 04/30/2011  . Smokeless tobacco: Never Used  . Alcohol use No    ALLERGIES:  is allergic to tamiflu [oseltamivir phosphate].  MEDICATIONS:  Current Outpatient Prescriptions  Medication Sig  Dispense Refill  . alendronate (FOSAMAX) 70 MG tablet     . amLODipine (NORVASC) 5 MG tablet     . anastrozole (ARIMIDEX) 1 MG tablet Take 1 tablet (1 mg total) by mouth daily. 90 tablet 5  . aspirin EC 325 MG tablet Take by mouth.    . carvedilol (COREG) 12.5 MG tablet     . metFORMIN (GLUMETZA) 500 MG (MOD) 24 hr tablet Take by mouth.    . quinapril (ACCUPRIL) 40 MG tablet     . rosuvastatin (CRESTOR) 40 MG tablet Take by mouth.     No current facility-administered medications for this visit.     PHYSICAL EXAMINATION: ECOG PERFORMANCE STATUS: 0 - Asymptomatic  BP (!) 144/81   Pulse 65   Temp 98.9 F (37.2 C)   Resp 20   There were no vitals filed for this visit.  GENERAL: Well-nourished well-developed; Alert, no distress and comfortable.   Alone. EYES: no pallor or icterus OROPHARYNX: no thrush or ulceration; good dentition  NECK: supple, no masses felt LYMPH:  no palpable lymphadenopathy in the cervical, axillary or inguinal regions LUNGS: clear to auscultation and  No wheeze or crackles HEART/CVS: regular rate & rhythm and no murmurs; No lower extremity edema ABDOMEN:abdomen soft, non-tender and normal bowel sounds Musculoskeletal:no cyanosis of digits and no clubbing  PSYCH: alert & oriented x 3 with fluent speech NEURO: no  focal motor/sensory deficits SKIN:  no rashes or significant lesions Right and left BREAST exam [in the presence of nurse practioner ]- no unusual skin changes or dominant masses felt. Surgical scars noted.    LABORATORY DATA:  I have reviewed the data as listed    Component Value Date/Time   NA 140 07/11/2016 1015   K 3.7 07/11/2016 1015   CL 106 07/11/2016 1015   CO2 27 07/11/2016 1015   GLUCOSE 130 (H) 07/11/2016 1015   BUN 12 07/11/2016 1015   CREATININE 0.76 07/11/2016 1015   CREATININE 0.91 07/09/2013 0951   CALCIUM 9.7 07/11/2016 1015   PROT 8.1 07/11/2016 1015   PROT 8.0 07/09/2013 0951   ALBUMIN 4.3 07/11/2016 1015   ALBUMIN 4.0  07/09/2013 0951   AST 23 07/11/2016 1015   AST 20 07/09/2013 0951   ALT 13 (L) 07/11/2016 1015   ALT 21 07/09/2013 0951   ALKPHOS 51 07/11/2016 1015   ALKPHOS 65 07/09/2013 0951   BILITOT 0.5 07/11/2016 1015   BILITOT 0.4 07/09/2013 0951   GFRNONAA >60 07/11/2016 1015   GFRNONAA >60 07/09/2013 0951   GFRAA >60 07/11/2016 1015   GFRAA >60 07/09/2013 0951    No results found for: SPEP, UPEP  Lab Results  Component Value Date   WBC 5.3 07/11/2016   NEUTROABS 3.0 07/11/2016   HGB 12.7 07/11/2016   HCT 37.8 07/11/2016   MCV 88.4 07/11/2016   PLT 178 07/11/2016      Chemistry      Component Value Date/Time   NA 140 07/11/2016 1015   K 3.7 07/11/2016 1015   CL 106 07/11/2016 1015   CO2 27 07/11/2016 1015   BUN 12 07/11/2016 1015   CREATININE 0.76 07/11/2016 1015   CREATININE 0.91 07/09/2013 0951      Component Value Date/Time   CALCIUM 9.7 07/11/2016 1015   ALKPHOS 51 07/11/2016 1015   ALKPHOS 65 07/09/2013 0951   AST 23 07/11/2016 1015   AST 20 07/09/2013 0951   ALT 13 (L) 07/11/2016 1015   ALT 21 07/09/2013 0951   BILITOT 0.5 07/11/2016 1015   BILITOT 0.4 07/09/2013 0951        ASSESSMENT & PLAN:   Carcinoma of overlapping sites of right breast in female, estrogen receptor positive ([place]) # BREAST CANCER- STAGE I; ER-pos/PR neg; Her 2 Neg' Intermediate risk on oncotype- no adjuvant chemo given.  Currently on Arimidex since January 2012. Clinically no evidence of recurrence noted.Recent mammogram in April 2016 within normal limits. I discussed the recent data of extended antihormone therapy to 10 years. Patient is interested in continuing beyond 5 years at this time especially as she is tolerating aromatase inhibitors well.   I would also check of breast cancer index. New prescription for anastrozole sent to the pharmacy.  # Osteopenia bone density June 2015- patient Fosamax And vitamin D. April 2017- osteopenia..  # Hot flashes- G-1-2.   # Patient follow-up  with me with CBC CMP one year.  Patient is due for mammogram again in April 2018. Will call re: results of BCI- and with recommendations re: continuation of arimidex.  Cammie Sickle, MD 07/16/2016 8:45 AM

## 2016-07-25 ENCOUNTER — Encounter: Payer: Self-pay | Admitting: Internal Medicine

## 2016-07-26 ENCOUNTER — Encounter: Payer: Self-pay | Admitting: Internal Medicine

## 2016-07-31 ENCOUNTER — Telehealth: Payer: Self-pay | Admitting: Internal Medicine

## 2016-07-31 NOTE — Telephone Encounter (Signed)
Please inform patient that her risk of recurrence after 5 years is fairly small; and hence as such the there is low likelihood of benefit of 10 years of anti-hormonal therapy. Recommend stopping Arimidex at this time. Follow-up as planned

## 2016-08-01 NOTE — Telephone Encounter (Signed)
Attempted to reach patient. vm box not set up. [id number]

## 2016-08-05 NOTE — Telephone Encounter (Signed)
Attempted to reach patient. Pt stated. Hello and the call was dropped. I attempted to call back again and received a msg "the vm box is not set up for prerson you are trying to reach"

## 2016-08-23 ENCOUNTER — Ambulatory Visit
Admission: RE | Admit: 2016-08-23 | Discharge: 2016-08-23 | Disposition: A | Discharge status: Home / Self Care | Payer: Medicare Other | Source: Ambulatory Visit | Attending: Internal Medicine | Admitting: Internal Medicine

## 2016-08-23 DIAGNOSIS — Z170 Estrogen receptor positive status [ER+]: Principal | ICD-10-CM

## 2016-08-23 DIAGNOSIS — C50811 Malignant neoplasm of overlapping sites of right female breast: Secondary | ICD-10-CM

## 2017-07-11 ENCOUNTER — Inpatient Hospital Stay (HOSPITAL_BASED_OUTPATIENT_CLINIC_OR_DEPARTMENT_OTHER): Payer: Medicare Other | Admitting: Internal Medicine

## 2017-07-11 ENCOUNTER — Other Ambulatory Visit: Payer: Self-pay

## 2017-07-11 ENCOUNTER — Inpatient Hospital Stay: Payer: Medicare Other | Attending: Internal Medicine

## 2017-07-11 ENCOUNTER — Encounter: Payer: Self-pay | Admitting: Internal Medicine

## 2017-07-11 VITALS — BP 125/76 | HR 60 | Temp 97.9°F | Resp 20 | Ht 66.0 in | Wt 174.0 lb

## 2017-07-11 DIAGNOSIS — I10 Essential (primary) hypertension: Secondary | ICD-10-CM | POA: Diagnosis not present

## 2017-07-11 DIAGNOSIS — Z7982 Long term (current) use of aspirin: Secondary | ICD-10-CM | POA: Insufficient documentation

## 2017-07-11 DIAGNOSIS — C50811 Malignant neoplasm of overlapping sites of right female breast: Secondary | ICD-10-CM

## 2017-07-11 DIAGNOSIS — Z87891 Personal history of nicotine dependence: Secondary | ICD-10-CM | POA: Insufficient documentation

## 2017-07-11 DIAGNOSIS — E119 Type 2 diabetes mellitus without complications: Secondary | ICD-10-CM | POA: Insufficient documentation

## 2017-07-11 DIAGNOSIS — Z79811 Long term (current) use of aromatase inhibitors: Secondary | ICD-10-CM

## 2017-07-11 DIAGNOSIS — M8580 Other specified disorders of bone density and structure, unspecified site: Secondary | ICD-10-CM | POA: Diagnosis not present

## 2017-07-11 DIAGNOSIS — Z7984 Long term (current) use of oral hypoglycemic drugs: Secondary | ICD-10-CM | POA: Insufficient documentation

## 2017-07-11 DIAGNOSIS — I2510 Atherosclerotic heart disease of native coronary artery without angina pectoris: Secondary | ICD-10-CM

## 2017-07-11 DIAGNOSIS — I252 Old myocardial infarction: Secondary | ICD-10-CM | POA: Insufficient documentation

## 2017-07-11 DIAGNOSIS — Z79899 Other long term (current) drug therapy: Secondary | ICD-10-CM

## 2017-07-11 DIAGNOSIS — E785 Hyperlipidemia, unspecified: Secondary | ICD-10-CM

## 2017-07-11 DIAGNOSIS — Z170 Estrogen receptor positive status [ER+]: Secondary | ICD-10-CM

## 2017-07-11 DIAGNOSIS — R232 Flushing: Secondary | ICD-10-CM

## 2017-07-11 LAB — COMPREHENSIVE METABOLIC PANEL
ALT: 18 U/L (ref 14–54)
AST: 26 U/L (ref 15–41)
Albumin: 3.7 g/dL (ref 3.5–5.0)
Alkaline Phosphatase: 54 U/L (ref 38–126)
Anion gap: 8 (ref 5–15)
BILIRUBIN TOTAL: 0.5 mg/dL (ref 0.3–1.2)
BUN: 18 mg/dL (ref 6–20)
CALCIUM: 9.9 mg/dL (ref 8.9–10.3)
CO2: 24 mmol/L (ref 22–32)
CREATININE: 0.71 mg/dL (ref 0.44–1.00)
Chloride: 108 mmol/L (ref 101–111)
GFR calc Af Amer: >60 mL/min (ref >60)
Glucose, Bld: 91 mg/dL (ref 65–99)
Potassium: 3.7 mmol/L (ref 3.5–5.1)
Sodium: 140 mmol/L (ref 135–145)
TOTAL PROTEIN: 7.2 g/dL (ref 6.5–8.1)

## 2017-07-11 LAB — CBC WITH DIFFERENTIAL/PLATELET
BASOS ABS: 0 10*3/uL (ref 0–0.1)
BASOS PCT: 1 %
EOS ABS: 0.3 10*3/uL (ref 0–0.7)
EOS PCT: 7 %
HCT: 34.7 % — ABNORMAL LOW (ref 35.0–47.0)
Hemoglobin: 11.6 g/dL — ABNORMAL LOW (ref 12.0–16.0)
Lymphocytes Relative: 33 %
Lymphs Abs: 1.7 10*3/uL (ref 1.0–3.6)
MCH: 30 pg (ref 26.0–34.0)
MCHC: 33.4 g/dL (ref 32.0–36.0)
MCV: 90 fL (ref 80.0–100.0)
Monocytes Absolute: 0.5 10*3/uL (ref 0.2–0.9)
Monocytes Relative: 10 %
Neutro Abs: 2.5 10*3/uL (ref 1.4–6.5)
Neutrophils Relative %: 49 %
PLATELETS: 169 10*3/uL (ref 150–440)
RBC: 3.86 MIL/uL (ref 3.80–5.20)
RDW: 14.1 % (ref 11.5–14.5)
WBC: 5.1 10*3/uL (ref 3.6–11.0)

## 2017-07-11 NOTE — Assessment & Plan Note (Addendum)
#   BREAST CANCER- STAGE I; ER-pos/PR neg; Her 2 Neg' Intermediate risk on oncotype- no adjuvant chemo given.  Currently on Arimidex since January 2012.  #  Clinically no evidence of recurrence noted. Recent mammogram in April 2018 within normal limits; recommend stopping Arimidex now as  BCI-LOW BENEFIT.  #Long discussion the patient regarding the rationale for stopping Arimidex at this time.  However she should continue self breast exams/also mammograms on a regular basis.  # Osteopenia bone density June 2017- patient Fosamax And vitamin D.  Bone density prior.  # follow up in 12 months/labs; mammo- April 2019. Stop Arimidex.   # 25 minutes face-to-face with the patient discussing the above plan of care; more than 50% of time spent on prognosis/ natural history; counseling and coordination.

## 2017-07-11 NOTE — Progress Notes (Signed)
[place] OFFICE PROGRESS NOTE  Patient Care Team: Frazier Richards, MD as PCP - General (Family Medicine)   SUMMARY OF ONCOLOGIC HISTORY: Oncology History   # RIGHT BREAST CANCER- STAGE I; ER-pos/PR neg; Her 2 Neg s/p Lumpec  S/p RT; Oncotype- 23/ intermediate risk [risk of recurrence 14%]-No adj chemo; Currently on Arimidex since January 2012; march 2017- cont Arimidex; BCI-LOW benefit for extended therapy; march 2019- STOP AI.   # BMD- June 2015- osteopenia     Carcinoma of overlapping sites of right breast in female, estrogen receptor positive ([place])     INTERVAL HISTORY:  67  year female patient with above history of stage I breast cancer on Arimidex is here for follow-up.   Patient denies any new lumps or bumps.  Appetite is good.  Patient has mild hot flashes.  Denies any bone pain.  No cough shortness of breath or chest pain.  Patient is tolerating anastrozole fairly well.   REVIEW OF SYSTEMS:  A complete 10 point review of system is done which is negative except mentioned above/history of present illness.     PAST MEDICAL HISTORY :  Past Medical History:  Diagnosis Date  . Breast cancer [facility]) 2011   Right- radiation  . CAD (coronary artery disease)   . Diabetes mellitus without complication ([place])   . HTN (hypertension)   . Hyperlipidemia   . MI (myocardial infarction) ([place])     PAST SURGICAL HISTORY :   Past Surgical History:  Procedure Laterality Date  . ABDOMINAL HYSTERECTOMY    . BREAST BIOPSY Right 2011   Positive  . BREAST LUMPECTOMY Right   . CORONARY ARTERY BYPASS GRAFT      FAMILY HISTORY :   Family History  Problem Relation Age of Onset  . Diabetes Mother   . Hypertension Father   . Breast cancer Neg Hx     SOCIAL HISTORY:   Social History   Tobacco Use  . Smoking status: Former Smoker    Last attempt to quit: 04/30/2011    Years since quitting: 6.2  . Smokeless tobacco: Never Used  Substance Use Topics  . Alcohol use: No     Alcohol/week: 0.0 oz  . Drug use: No    ALLERGIES:  is allergic to tamiflu [oseltamivir phosphate].  MEDICATIONS:  Current Outpatient Medications  Medication Sig Dispense Refill  . alendronate (FOSAMAX) 70 MG tablet Take 70 mg by mouth once a week.     Marland Kitchen amLODipine (NORVASC) 5 MG tablet Take 5 mg by mouth daily.     Marland Kitchen anastrozole (ARIMIDEX) 1 MG tablet Take 1 tablet (1 mg total) by mouth daily. 90 tablet 5  . aspirin EC 325 MG tablet Take 325 mg by mouth daily.     . carvedilol (COREG) 12.5 MG tablet Take 12.5 mg by mouth 2 (two) times daily with a meal.     . metFORMIN (GLUMETZA) 500 MG (MOD) 24 hr tablet Take 500 mg by mouth daily with breakfast.     . quinapril (ACCUPRIL) 40 MG tablet Take 40 mg by mouth daily.     . rosuvastatin (CRESTOR) 40 MG tablet Take 40 mg by mouth daily.     . Multiple Vitamin (MULTI-VITAMINS) TABS Take 1 tablet by mouth daily.     No current facility-administered medications for this visit.     PHYSICAL EXAMINATION: ECOG PERFORMANCE STATUS: 0 - Asymptomatic  BP 125/76 (BP Location: Left Arm, Patient Position: Sitting)   Pulse 60  Temp 97.9 F (36.6 C) (Tympanic)   Resp 20   Ht 5\' 6"  (1.676 m)   Wt 174 lb (78.9 kg)   BMI 28.08 kg/m   Filed Weights   07/11/17 1443  Weight: 174 lb (78.9 kg)    GENERAL: Well-nourished well-developed; Alert, no distress and comfortable.   Alone. EYES: no pallor or icterus OROPHARYNX: no thrush or ulceration; good dentition  NECK: supple, no masses felt LYMPH:  no palpable lymphadenopathy in the cervical, axillary or inguinal regions LUNGS: clear to auscultation and  No wheeze or crackles HEART/CVS: regular rate & rhythm and no murmurs; No lower extremity edema ABDOMEN:abdomen soft, non-tender and normal bowel sounds Musculoskeletal:no cyanosis of digits and no clubbing  PSYCH: alert & oriented x 3 with fluent speech NEURO: no focal motor/sensory deficits SKIN:  no rashes or significant lesions Right and  left BREAST exam [in the presence of nurse practioner ]- no unusual skin changes or dominant masses felt. Surgical scars noted.    LABORATORY DATA:  I have reviewed the data as listed    Component Value Date/Time   NA 140 07/11/2017 1419   K 3.7 07/11/2017 1419   CL 108 07/11/2017 1419   CO2 24 07/11/2017 1419   GLUCOSE 91 07/11/2017 1419   BUN 18 07/11/2017 1419   CREATININE 0.71 07/11/2017 1419   CREATININE 0.91 07/09/2013 0951   CALCIUM 9.9 07/11/2017 1419   PROT 7.2 07/11/2017 1419   PROT 8.0 07/09/2013 0951   ALBUMIN 3.7 07/11/2017 1419   ALBUMIN 4.0 07/09/2013 0951   AST 26 07/11/2017 1419   AST 20 07/09/2013 0951   ALT 18 07/11/2017 1419   ALT 21 07/09/2013 0951   ALKPHOS 54 07/11/2017 1419   ALKPHOS 65 07/09/2013 0951   BILITOT 0.5 07/11/2017 1419   BILITOT 0.4 07/09/2013 0951   GFRNONAA >60 07/11/2017 1419   GFRNONAA >60 07/09/2013 0951   GFRAA >60 07/11/2017 1419   GFRAA >60 07/09/2013 0951    No results found for: SPEP, UPEP  Lab Results  Component Value Date   WBC 5.1 07/11/2017   NEUTROABS 2.5 07/11/2017   HGB 11.6 (L) 07/11/2017   HCT 34.7 (L) 07/11/2017   MCV 90.0 07/11/2017   PLT 169 07/11/2017      Chemistry      Component Value Date/Time   NA 140 07/11/2017 1419   K 3.7 07/11/2017 1419   CL 108 07/11/2017 1419   CO2 24 07/11/2017 1419   BUN 18 07/11/2017 1419   CREATININE 0.71 07/11/2017 1419   CREATININE 0.91 07/09/2013 0951      Component Value Date/Time   CALCIUM 9.9 07/11/2017 1419   ALKPHOS 54 07/11/2017 1419   ALKPHOS 65 07/09/2013 0951   AST 26 07/11/2017 1419   AST 20 07/09/2013 0951   ALT 18 07/11/2017 1419   ALT 21 07/09/2013 0951   BILITOT 0.5 07/11/2017 1419   BILITOT 0.4 07/09/2013 0951        ASSESSMENT & PLAN:   Carcinoma of overlapping sites of right breast in female, estrogen receptor positive ([place]) # BREAST CANCER- STAGE I; ER-pos/PR neg; Her 2 Neg' Intermediate risk on oncotype- no adjuvant chemo given.   Currently on Arimidex since January 2012.  #  Clinically no evidence of recurrence noted. Recent mammogram in April 2018 within normal limits; recommend stopping Arimidex now as  BCI-LOW BENEFIT.  #Long discussion the patient regarding the rationale for stopping Arimidex at this time.  However she should continue self  breast exams/also mammograms on a regular basis.  # Osteopenia bone density June 2017- patient Fosamax And vitamin D.  Bone density prior.  # follow up in 12 months/labs; mammo- April 2019. Stop Arimidex.   # 25 minutes face-to-face with the patient discussing the above plan of care; more than 50% of time spent on prognosis/ natural history; counseling and coordination.                                                                                                                                                                                         Cammie Sickle, MD 07/13/2017 8:09 AM

## 2017-08-25 ENCOUNTER — Telehealth: Payer: Self-pay | Admitting: *Deleted

## 2017-08-25 NOTE — Telephone Encounter (Signed)
Molly Mosley, patient has mammogram schdeuled on 08/27/17 - please schedule follow up appt with Dr. B after mammogram appt

## 2017-08-25 NOTE — Telephone Encounter (Signed)
Patient called to report that she was to be scheduled for a mammogram and has not heard anything about an appointment. I see that she has an appointment 5/1 and I called her back and she stated she just made the appointment. She also does not have a follow up appointment with Dr B.

## 2017-08-27 ENCOUNTER — Ambulatory Visit
Admission: RE | Admit: 2017-08-27 | Discharge: 2017-08-27 | Disposition: A | Discharge status: Home / Self Care | Payer: Medicare Other | Source: Ambulatory Visit | Attending: Internal Medicine | Admitting: Internal Medicine

## 2017-08-27 DIAGNOSIS — Z170 Estrogen receptor positive status [ER+]: Secondary | ICD-10-CM | POA: Insufficient documentation

## 2017-08-27 DIAGNOSIS — Z1231 Encounter for screening mammogram for malignant neoplasm of breast: Secondary | ICD-10-CM | POA: Diagnosis present

## 2017-08-27 DIAGNOSIS — C50811 Malignant neoplasm of overlapping sites of right female breast: Secondary | ICD-10-CM | POA: Insufficient documentation

## 2017-08-27 HISTORY — DX: Personal history of irradiation: Z92.3

## 2017-08-29 ENCOUNTER — Other Ambulatory Visit: Payer: Self-pay

## 2017-08-29 ENCOUNTER — Encounter: Payer: Self-pay | Admitting: Internal Medicine

## 2017-08-29 ENCOUNTER — Inpatient Hospital Stay: Payer: Medicare Other | Attending: Internal Medicine | Admitting: Internal Medicine

## 2017-08-29 VITALS — BP 138/70 | HR 82 | Temp 97.9°F | Resp 20 | Ht 66.0 in | Wt 174.5 lb

## 2017-08-29 DIAGNOSIS — I10 Essential (primary) hypertension: Secondary | ICD-10-CM

## 2017-08-29 DIAGNOSIS — Z7984 Long term (current) use of oral hypoglycemic drugs: Secondary | ICD-10-CM | POA: Insufficient documentation

## 2017-08-29 DIAGNOSIS — Z853 Personal history of malignant neoplasm of breast: Secondary | ICD-10-CM

## 2017-08-29 DIAGNOSIS — D649 Anemia, unspecified: Secondary | ICD-10-CM | POA: Diagnosis not present

## 2017-08-29 DIAGNOSIS — E785 Hyperlipidemia, unspecified: Secondary | ICD-10-CM | POA: Diagnosis not present

## 2017-08-29 DIAGNOSIS — I252 Old myocardial infarction: Secondary | ICD-10-CM | POA: Diagnosis not present

## 2017-08-29 DIAGNOSIS — Z87891 Personal history of nicotine dependence: Secondary | ICD-10-CM

## 2017-08-29 DIAGNOSIS — E119 Type 2 diabetes mellitus without complications: Secondary | ICD-10-CM | POA: Diagnosis not present

## 2017-08-29 DIAGNOSIS — Z9223 Personal history of estrogen therapy: Secondary | ICD-10-CM | POA: Diagnosis not present

## 2017-08-29 DIAGNOSIS — C50811 Malignant neoplasm of overlapping sites of right female breast: Secondary | ICD-10-CM

## 2017-08-29 DIAGNOSIS — Z923 Personal history of irradiation: Secondary | ICD-10-CM

## 2017-08-29 DIAGNOSIS — I2510 Atherosclerotic heart disease of native coronary artery without angina pectoris: Secondary | ICD-10-CM | POA: Diagnosis not present

## 2017-08-29 DIAGNOSIS — Z79899 Other long term (current) drug therapy: Secondary | ICD-10-CM | POA: Insufficient documentation

## 2017-08-29 DIAGNOSIS — Z170 Estrogen receptor positive status [ER+]: Secondary | ICD-10-CM | POA: Diagnosis not present

## 2017-08-29 DIAGNOSIS — M8580 Other specified disorders of bone density and structure, unspecified site: Secondary | ICD-10-CM

## 2017-08-29 DIAGNOSIS — Z7982 Long term (current) use of aspirin: Secondary | ICD-10-CM | POA: Insufficient documentation

## 2017-08-29 NOTE — Progress Notes (Signed)
[place] OFFICE PROGRESS NOTE  Patient Care Team: Frazier Richards, MD as PCP - General (Family Medicine)   SUMMARY OF ONCOLOGIC HISTORY: Oncology History   # RIGHT BREAST CANCER- STAGE I; ER-pos/PR neg; Her 2 Neg s/p Lumpec  S/p RT; Oncotype- 23/ intermediate risk [risk of recurrence 14%]-No adj chemo; Currently on Arimidex since January 2012; march 2017- cont Arimidex; BCI-LOW benefit for extended therapy; April 2019- STOP AI.   # BMD- June 2015- osteopenia     Carcinoma of overlapping sites of right breast in female, estrogen receptor positive ([place])     INTERVAL HISTORY:  67  year female patient with above history of stage I breast cancer s/p Arimidex is here for follow-up.   Patient finished anastrozole; approximately 1 month ago. Patient denies any new lumps or bumps.  Appetite is good.    Patient's hot flashes improved since stopping the anastrozole. Denies any bone pain.  No cough shortness of breath or chest pain.  She denies any blood in stools or black-colored stools.  States her last colonoscopy was approximately 8 years ago.  REVIEW OF SYSTEMS:  A complete 10 point review of system is done which is negative except mentioned above/history of present illness.     PAST MEDICAL HISTORY :  Past Medical History:  Diagnosis Date  . Breast cancer [facility]) 2011   Right- radiation  . CAD (coronary artery disease)   . Diabetes mellitus without complication ([place])   . HTN (hypertension)   . Hyperlipidemia   . MI (myocardial infarction) ([place])   . Personal history of radiation therapy 2011   Right breast    PAST SURGICAL HISTORY :   Past Surgical History:  Procedure Laterality Date  . ABDOMINAL HYSTERECTOMY    . BREAST BIOPSY Right 2011   Positive  . BREAST LUMPECTOMY Right   . CORONARY ARTERY BYPASS GRAFT      FAMILY HISTORY :   Family History  Problem Relation Age of Onset  . Diabetes Mother   . Hypertension Father   . Breast cancer Neg Hx      SOCIAL HISTORY:   Social History   Tobacco Use  . Smoking status: Former Smoker    Last attempt to quit: 04/30/2011    Years since quitting: 6.3  . Smokeless tobacco: Never Used  Substance Use Topics  . Alcohol use: No    Alcohol/week: 0.0 oz  . Drug use: No    ALLERGIES:  is allergic to tamiflu [oseltamivir phosphate].  MEDICATIONS:  Current Outpatient Medications  Medication Sig Dispense Refill  . alendronate (FOSAMAX) 70 MG tablet Take 70 mg by mouth once a week.     Marland Kitchen amLODipine (NORVASC) 5 MG tablet Take 5 mg by mouth daily.     Marland Kitchen aspirin EC 325 MG tablet Take 325 mg by mouth daily.     . carvedilol (COREG) 12.5 MG tablet Take 12.5 mg by mouth 2 (two) times daily with a meal.     . metFORMIN (GLUMETZA) 500 MG (MOD) 24 hr tablet Take 500 mg by mouth daily with breakfast.     . Multiple Vitamin (MULTI-VITAMINS) TABS Take 1 tablet by mouth daily.    . quinapril (ACCUPRIL) 40 MG tablet Take 40 mg by mouth daily.     . rosuvastatin (CRESTOR) 40 MG tablet Take 40 mg by mouth daily.      No current facility-administered medications for this visit.     PHYSICAL EXAMINATION: ECOG PERFORMANCE STATUS: 0 -  Asymptomatic  BP 138/70   Pulse 82   Temp 97.9 F (36.6 C) (Tympanic)   Resp 20   Ht 5\' 6"  (1.676 m)   Wt 174 lb 8 oz (79.2 kg)   BMI 28.17 kg/m   Filed Weights   08/29/17 1120  Weight: 174 lb 8 oz (79.2 kg)    GENERAL: Well-nourished well-developed; Alert, no distress and comfortable.   Alone. EYES: no pallor or icterus OROPHARYNX: no thrush or ulceration; good dentition  NECK: supple, no masses felt LYMPH:  no palpable lymphadenopathy in the cervical, axillary or inguinal regions LUNGS: clear to auscultation and  No wheeze or crackles HEART/CVS: regular rate & rhythm and no murmurs; No lower extremity edema ABDOMEN:abdomen soft, non-tender and normal bowel sounds Musculoskeletal:no cyanosis of digits and no clubbing  PSYCH: alert & oriented x 3 with fluent  speech NEURO: no focal motor/sensory deficits SKIN:  no rashes or significant lesions Right and left BREAST exam [in the presence of nurse practioner ]- no unusual skin changes or dominant masses felt. Surgical scars noted.    LABORATORY DATA:  I have reviewed the data as listed    Component Value Date/Time   NA 140 07/11/2017 1419   K 3.7 07/11/2017 1419   CL 108 07/11/2017 1419   CO2 24 07/11/2017 1419   GLUCOSE 91 07/11/2017 1419   BUN 18 07/11/2017 1419   CREATININE 0.71 07/11/2017 1419   CREATININE 0.91 07/09/2013 0951   CALCIUM 9.9 07/11/2017 1419   PROT 7.2 07/11/2017 1419   PROT 8.0 07/09/2013 0951   ALBUMIN 3.7 07/11/2017 1419   ALBUMIN 4.0 07/09/2013 0951   AST 26 07/11/2017 1419   AST 20 07/09/2013 0951   ALT 18 07/11/2017 1419   ALT 21 07/09/2013 0951   ALKPHOS 54 07/11/2017 1419   ALKPHOS 65 07/09/2013 0951   BILITOT 0.5 07/11/2017 1419   BILITOT 0.4 07/09/2013 0951   GFRNONAA >60 07/11/2017 1419   GFRNONAA >60 07/09/2013 0951   GFRAA >60 07/11/2017 1419   GFRAA >60 07/09/2013 0951    No results found for: SPEP, UPEP  Lab Results  Component Value Date   WBC 5.1 07/11/2017   NEUTROABS 2.5 07/11/2017   HGB 11.6 (L) 07/11/2017   HCT 34.7 (L) 07/11/2017   MCV 90.0 07/11/2017   PLT 169 07/11/2017      Chemistry      Component Value Date/Time   NA 140 07/11/2017 1419   K 3.7 07/11/2017 1419   CL 108 07/11/2017 1419   CO2 24 07/11/2017 1419   BUN 18 07/11/2017 1419   CREATININE 0.71 07/11/2017 1419   CREATININE 0.91 07/09/2013 0951      Component Value Date/Time   CALCIUM 9.9 07/11/2017 1419   ALKPHOS 54 07/11/2017 1419   ALKPHOS 65 07/09/2013 0951   AST 26 07/11/2017 1419   AST 20 07/09/2013 0951   ALT 18 07/11/2017 1419   ALT 21 07/09/2013 0951   BILITOT 0.5 07/11/2017 1419   BILITOT 0.4 07/09/2013 0951        ASSESSMENT & PLAN:   Carcinoma of overlapping sites of right breast in female, estrogen receptor positive ([place]) # BREAST  CANCER- STAGE I; ER-pos/PR neg; Her 2 Neg' Intermediate risk on oncotype- no adjuvant chemo given.  #  Stopped April 2019 Arimidex [ since January 2012]- improved energy/ hot flashes.  Clinically no evidence of recurrence.  # Mild anemia- Hb 11.6/ colo- 2011-08- hyperplastic polyps [next surveillance in 2021]; recommend blood work  in 3 months/iron studies//ferrtin/stool studies x2.   # Osteopenia bone density June 2017-stable.  Continue Fosamax And vitamin D; BMD prior.   # Follow up in 12 months/labs; mammo- April 2020; will call if above results are abonrla. BMD.   # 25 minutes face-to-face with the patient discussing the above plan of care; more than 50% of time spent on prognosis/ natural history; counseling and coordination.                                                                                                                                                                                       Cammie Sickle, MD 08/29/2017 1:20 PM

## 2017-08-29 NOTE — Assessment & Plan Note (Addendum)
#   BREAST CANCER- STAGE I; ER-pos/PR neg; Her 2 Neg' Intermediate risk on oncotype- no adjuvant chemo given.  #  Stopped April 2019 Arimidex [ since January 2012]- improved energy/ hot flashes.  Clinically no evidence of recurrence.  # Mild anemia- Hb 11.6/ colo- 2011-08- hyperplastic polyps [next surveillance in 2021]; recommend blood work in 3 months/iron studies//ferrtin/stool studies x2.   # Osteopenia bone density June 2017-stable.  Continue Fosamax And vitamin D; BMD prior.   # Follow up in 12 months/labs; mammo- April 2020; will call if above results are abonrla. BMD.   # 25 minutes face-to-face with the patient discussing the above plan of care; more than 50% of time spent on prognosis/ natural history; counseling and coordination.

## 2017-09-15 ENCOUNTER — Other Ambulatory Visit: Payer: Self-pay | Admitting: Nurse Practitioner

## 2017-09-15 DIAGNOSIS — M8580 Other specified disorders of bone density and structure, unspecified site: Secondary | ICD-10-CM

## 2017-09-16 ENCOUNTER — Other Ambulatory Visit: Payer: Self-pay | Admitting: Nurse Practitioner

## 2017-09-16 DIAGNOSIS — M8580 Other specified disorders of bone density and structure, unspecified site: Secondary | ICD-10-CM

## 2017-10-22 ENCOUNTER — Ambulatory Visit
Admission: RE | Admit: 2017-10-22 | Discharge: 2017-10-22 | Disposition: A | Discharge status: Home / Self Care | Payer: Medicare Other | Source: Ambulatory Visit | Attending: Nurse Practitioner | Admitting: Nurse Practitioner

## 2017-10-22 DIAGNOSIS — Z7984 Long term (current) use of oral hypoglycemic drugs: Secondary | ICD-10-CM | POA: Diagnosis not present

## 2017-10-22 DIAGNOSIS — Z853 Personal history of malignant neoplasm of breast: Secondary | ICD-10-CM | POA: Insufficient documentation

## 2017-10-22 DIAGNOSIS — M85851 Other specified disorders of bone density and structure, right thigh: Secondary | ICD-10-CM | POA: Insufficient documentation

## 2017-10-22 DIAGNOSIS — M8580 Other specified disorders of bone density and structure, unspecified site: Secondary | ICD-10-CM

## 2017-10-22 DIAGNOSIS — M85832 Other specified disorders of bone density and structure, left forearm: Secondary | ICD-10-CM | POA: Diagnosis not present

## 2017-10-22 DIAGNOSIS — Z7983 Long term (current) use of bisphosphonates: Secondary | ICD-10-CM | POA: Insufficient documentation

## 2017-11-28 ENCOUNTER — Inpatient Hospital Stay: Payer: Medicare Other | Attending: Internal Medicine

## 2017-11-28 DIAGNOSIS — Z853 Personal history of malignant neoplasm of breast: Secondary | ICD-10-CM | POA: Insufficient documentation

## 2017-11-28 DIAGNOSIS — Z9223 Personal history of estrogen therapy: Secondary | ICD-10-CM | POA: Diagnosis not present

## 2017-11-28 DIAGNOSIS — Z170 Estrogen receptor positive status [ER+]: Secondary | ICD-10-CM | POA: Diagnosis not present

## 2017-11-28 DIAGNOSIS — D649 Anemia, unspecified: Secondary | ICD-10-CM | POA: Diagnosis not present

## 2017-11-28 DIAGNOSIS — Z923 Personal history of irradiation: Secondary | ICD-10-CM | POA: Diagnosis not present

## 2017-11-28 DIAGNOSIS — C50811 Malignant neoplasm of overlapping sites of right female breast: Secondary | ICD-10-CM

## 2017-11-28 LAB — COMPREHENSIVE METABOLIC PANEL
ALBUMIN: 4.2 g/dL (ref 3.5–5.0)
ALK PHOS: 60 U/L (ref 38–126)
ALT: 20 U/L (ref 0–44)
ANION GAP: 9 (ref 5–15)
AST: 27 U/L (ref 15–41)
BILIRUBIN TOTAL: 0.6 mg/dL (ref 0.3–1.2)
BUN: 13 mg/dL (ref 8–23)
CALCIUM: 10.6 mg/dL — AB (ref 8.9–10.3)
CO2: 26 mmol/L (ref 22–32)
Chloride: 105 mmol/L (ref 98–111)
Creatinine, Ser: 0.86 mg/dL (ref 0.44–1.00)
GLUCOSE: 91 mg/dL (ref 70–99)
POTASSIUM: 3.6 mmol/L (ref 3.5–5.1)
Sodium: 140 mmol/L (ref 135–145)
TOTAL PROTEIN: 7.8 g/dL (ref 6.5–8.1)

## 2017-11-28 LAB — IRON AND TIBC
IRON: 80 ug/dL (ref 28–170)
SATURATION RATIOS: 24 % (ref 10.4–31.8)
TIBC: 336 ug/dL (ref 250–450)
UIBC: 256 ug/dL

## 2017-11-28 LAB — CBC WITH DIFFERENTIAL/PLATELET
BASOS ABS: 0 10*3/uL (ref 0–0.1)
BASOS PCT: 1 %
Eosinophils Absolute: 0.4 10*3/uL (ref 0–0.7)
Eosinophils Relative: 7 %
HEMATOCRIT: 39 % (ref 35.0–47.0)
Hemoglobin: 12.8 g/dL (ref 12.0–16.0)
LYMPHS PCT: 30 %
Lymphs Abs: 1.6 10*3/uL (ref 1.0–3.6)
MCH: 29.4 pg (ref 26.0–34.0)
MCHC: 32.7 g/dL (ref 32.0–36.0)
MCV: 89.9 fL (ref 80.0–100.0)
MONO ABS: 0.5 10*3/uL (ref 0.2–0.9)
Monocytes Relative: 9 %
NEUTROS ABS: 2.8 10*3/uL (ref 1.4–6.5)
NEUTROS PCT: 53 %
Platelets: 181 10*3/uL (ref 150–440)
RBC: 4.34 MIL/uL (ref 3.80–5.20)
RDW: 14.9 % — AB (ref 11.5–14.5)
WBC: 5.2 10*3/uL (ref 3.6–11.0)

## 2017-11-28 LAB — FERRITIN: FERRITIN: 72 ng/mL (ref 11–307)

## 2017-12-09 DIAGNOSIS — Z853 Personal history of malignant neoplasm of breast: Secondary | ICD-10-CM | POA: Diagnosis not present

## 2017-12-17 ENCOUNTER — Other Ambulatory Visit: Payer: Self-pay

## 2017-12-17 DIAGNOSIS — Z170 Estrogen receptor positive status [ER+]: Principal | ICD-10-CM

## 2017-12-17 DIAGNOSIS — C50811 Malignant neoplasm of overlapping sites of right female breast: Secondary | ICD-10-CM

## 2017-12-17 LAB — OCCULT BLOOD X 1 CARD TO LAB, STOOL: FECAL OCCULT BLD: NEGATIVE

## 2018-08-26 ENCOUNTER — Telehealth: Payer: Self-pay | Admitting: *Deleted

## 2018-08-26 NOTE — Telephone Encounter (Signed)
Patient agreeable to virtual visit next week. She had labs drawn (metc and lipid panel) with pcp at [facility] clinic this past week. I contacted this office on her behalf. The office will fax results to [phone]. Per Dr. Jacinto Reap- no need to order cbc at this time. Ok per md to cnl lab apt.

## 2018-08-28 ENCOUNTER — Telehealth: Payer: Self-pay | Admitting: Internal Medicine

## 2018-08-31 ENCOUNTER — Other Ambulatory Visit: Payer: Self-pay

## 2018-08-31 ENCOUNTER — Encounter: Payer: Self-pay | Admitting: Internal Medicine

## 2018-08-31 ENCOUNTER — Inpatient Hospital Stay: Payer: Medicare Other

## 2018-08-31 ENCOUNTER — Other Ambulatory Visit: Payer: Self-pay | Admitting: Internal Medicine

## 2018-08-31 ENCOUNTER — Inpatient Hospital Stay: Payer: Medicare Other | Attending: Internal Medicine | Admitting: Internal Medicine

## 2018-08-31 DIAGNOSIS — Z1231 Encounter for screening mammogram for malignant neoplasm of breast: Secondary | ICD-10-CM

## 2018-08-31 DIAGNOSIS — C50811 Malignant neoplasm of overlapping sites of right female breast: Secondary | ICD-10-CM

## 2018-08-31 DIAGNOSIS — Z170 Estrogen receptor positive status [ER+]: Principal | ICD-10-CM

## 2018-08-31 DIAGNOSIS — Z7983 Long term (current) use of bisphosphonates: Secondary | ICD-10-CM

## 2018-08-31 NOTE — Progress Notes (Signed)
I connected with Molly Mosley on 08/31/18 at 10:30 AM EDT by video enabled telemedicine visit and verified that I am speaking with the correct person using two identifiers.  I discussed the limitations, risks, security and privacy concerns of performing an evaluation and management service by telemedicine and the availability of in-person appointments. I also discussed with the patient that there may be a patient responsible charge related to this service. The patient expressed understanding and agreed to proceed.    Other persons participating in the visit and their role in the encounter: none Patient's location: Home  Provider's location: Home  Oncology History   # RIGHT BREAST CANCER- STAGE I; ER-pos/PR neg; Her 2 Neg s/p Lumpec  S/p RT; Oncotype- 23/ intermediate risk [risk of recurrence 14%]-No adj chemo; Currently on Arimidex since January 2012; march 2017- cont Arimidex; BCI-LOW benefit for extended therapy; April 2019- STOP AI.   # BMD- June 2015- osteopenia     Carcinoma of overlapping sites of right breast in female, estrogen receptor positive ([place])     Chief Complaint: Breast cancer   History of present illness:Molly Mosley 68 y.o.  female with history of stage I breast cancer currently on surveillance.  Patient stopped taking Arimidex in April 2019.  Patient denies any unusual body aches joint pains or bone pain.  No nausea no vomiting.  Headaches.  Observation/objective: No labs.  Assessment and plan: Carcinoma of overlapping sites of right breast in female, estrogen receptor positive ([place]) # BREAST CANCER- STAGE I; ER-pos/PR neg; Her 2 Neg finished AI April 2019.  Clinically no evidence of recurrence.  We will plan to get mammogram screening this month.  #Mild hypercalcemia calcium 10.6-September 2019.  Recommend repeating the labs.  # Osteopenia bone density May 2019- T score: -1.2.   Continue Fosamax And vitamin D; BMD prior.   # DISPOSITION: #Labs-CBC  CMP/bilateral screening mammogram [please schedule same day] next 1 to 2 weeks.  #Follow-up in 12 months-MD CBC CMP- Dr.B       Follow-up instructions:  I discussed the assessment and treatment plan with the patient.  The patient was provided an opportunity to ask questions and all were answered.  The patient agreed with the plan and demonstrated understanding of instructions.  The patient was advised to call back or seek an in person evaluation if the symptoms worsen or if the condition fails to improve as anticipated.    Dr. Charlaine Dalton [place] at [facility] 08/31/2018 11:08 AM

## 2018-08-31 NOTE — Assessment & Plan Note (Addendum)
#   BREAST CANCER- STAGE I; ER-pos/PR neg; Her 2 Neg finished AI April 2019.  Clinically no evidence of recurrence.  We will plan to get mammogram screening this month.  #Mild hypercalcemia calcium 10.6-September 2019.  Recommend repeating the labs.  # Osteopenia bone density May 2019- T score: -1.2.   Continue Fosamax And vitamin D; BMD prior.   # DISPOSITION: #Labs-CBC CMP/bilateral screening mammogram [please schedule same day] next 1 to 2 weeks.  #Follow-up in 12 months-MD CBC CMP- Dr.B

## 2018-10-29 ENCOUNTER — Other Ambulatory Visit: Payer: Self-pay

## 2018-10-29 ENCOUNTER — Inpatient Hospital Stay: Payer: Medicare Other | Attending: Internal Medicine

## 2018-10-29 ENCOUNTER — Ambulatory Visit
Admission: RE | Admit: 2018-10-29 | Discharge: 2018-10-29 | Disposition: A | Discharge status: Home / Self Care | Payer: Medicare Other | Source: Ambulatory Visit | Attending: Internal Medicine | Admitting: Internal Medicine

## 2018-10-29 DIAGNOSIS — Z170 Estrogen receptor positive status [ER+]: Secondary | ICD-10-CM | POA: Diagnosis not present

## 2018-10-29 DIAGNOSIS — Z1231 Encounter for screening mammogram for malignant neoplasm of breast: Secondary | ICD-10-CM

## 2018-10-29 DIAGNOSIS — C50811 Malignant neoplasm of overlapping sites of right female breast: Secondary | ICD-10-CM

## 2018-10-29 DIAGNOSIS — Z7983 Long term (current) use of bisphosphonates: Secondary | ICD-10-CM | POA: Insufficient documentation

## 2018-10-29 LAB — CBC WITH DIFFERENTIAL/PLATELET
Abs Immature Granulocytes: 0.01 10*3/uL (ref 0.00–0.07)
Basophils Absolute: 0 10*3/uL (ref 0.0–0.1)
Basophils Relative: 0 %
Eosinophils Absolute: 0.2 10*3/uL (ref 0.0–0.5)
Eosinophils Relative: 5 %
HCT: 39.5 % (ref 36.0–46.0)
Hemoglobin: 12.6 g/dL (ref 12.0–15.0)
Immature Granulocytes: 0 %
Lymphocytes Relative: 24 %
Lymphs Abs: 1.2 10*3/uL (ref 0.7–4.0)
MCH: 29.2 pg (ref 26.0–34.0)
MCHC: 31.9 g/dL (ref 30.0–36.0)
MCV: 91.6 fL (ref 80.0–100.0)
Monocytes Absolute: 0.3 10*3/uL (ref 0.1–1.0)
Monocytes Relative: 6 %
Neutro Abs: 3.1 10*3/uL (ref 1.7–7.7)
Neutrophils Relative %: 65 %
Platelets: 178 10*3/uL (ref 150–400)
RBC: 4.31 MIL/uL (ref 3.87–5.11)
RDW: 13 % (ref 11.5–15.5)
WBC: 4.8 10*3/uL (ref 4.0–10.5)
nRBC: 0 % (ref 0.0–0.2)

## 2018-10-29 LAB — COMPREHENSIVE METABOLIC PANEL
ALT: 14 U/L (ref 0–44)
AST: 21 U/L (ref 15–41)
Albumin: 4.4 g/dL (ref 3.5–5.0)
Alkaline Phosphatase: 55 U/L (ref 38–126)
Anion gap: 10 (ref 5–15)
BUN: 11 mg/dL (ref 8–23)
CO2: 28 mmol/L (ref 22–32)
Calcium: 10.2 mg/dL (ref 8.9–10.3)
Chloride: 105 mmol/L (ref 98–111)
Creatinine, Ser: 0.74 mg/dL (ref 0.44–1.00)
GFR calc Af Amer: >60 mL/min (ref >60)
GFR calc non Af Amer: >60 mL/min (ref >60)
Glucose, Bld: 186 mg/dL — ABNORMAL HIGH (ref 70–99)
Potassium: 3.5 mmol/L (ref 3.5–5.1)
Sodium: 143 mmol/L (ref 135–145)
Total Bilirubin: 0.5 mg/dL (ref 0.3–1.2)
Total Protein: 8 g/dL (ref 6.5–8.1)

## 2019-08-31 ENCOUNTER — Encounter (INDEPENDENT_AMBULATORY_CARE_PROVIDER_SITE_OTHER): Payer: Self-pay

## 2019-08-31 ENCOUNTER — Other Ambulatory Visit: Payer: Self-pay

## 2019-08-31 ENCOUNTER — Inpatient Hospital Stay: Payer: Medicare Other | Attending: Internal Medicine | Admitting: Internal Medicine

## 2019-08-31 VITALS — BP 144/74 | HR 53 | Temp 96.0°F | Resp 18 | Wt 178.0 lb

## 2019-08-31 DIAGNOSIS — C50811 Malignant neoplasm of overlapping sites of right female breast: Secondary | ICD-10-CM | POA: Insufficient documentation

## 2019-08-31 DIAGNOSIS — Z170 Estrogen receptor positive status [ER+]: Secondary | ICD-10-CM | POA: Insufficient documentation

## 2019-08-31 DIAGNOSIS — E119 Type 2 diabetes mellitus without complications: Secondary | ICD-10-CM | POA: Insufficient documentation

## 2019-08-31 DIAGNOSIS — I2510 Atherosclerotic heart disease of native coronary artery without angina pectoris: Secondary | ICD-10-CM | POA: Diagnosis not present

## 2019-08-31 DIAGNOSIS — Z923 Personal history of irradiation: Secondary | ICD-10-CM | POA: Diagnosis not present

## 2019-08-31 DIAGNOSIS — E785 Hyperlipidemia, unspecified: Secondary | ICD-10-CM | POA: Insufficient documentation

## 2019-08-31 DIAGNOSIS — Z833 Family history of diabetes mellitus: Secondary | ICD-10-CM | POA: Diagnosis not present

## 2019-08-31 DIAGNOSIS — Z8249 Family history of ischemic heart disease and other diseases of the circulatory system: Secondary | ICD-10-CM | POA: Insufficient documentation

## 2019-08-31 DIAGNOSIS — Z7984 Long term (current) use of oral hypoglycemic drugs: Secondary | ICD-10-CM | POA: Insufficient documentation

## 2019-08-31 DIAGNOSIS — Z87891 Personal history of nicotine dependence: Secondary | ICD-10-CM | POA: Insufficient documentation

## 2019-08-31 DIAGNOSIS — Z79899 Other long term (current) drug therapy: Secondary | ICD-10-CM | POA: Diagnosis not present

## 2019-08-31 DIAGNOSIS — I10 Essential (primary) hypertension: Secondary | ICD-10-CM | POA: Insufficient documentation

## 2019-08-31 DIAGNOSIS — M8580 Other specified disorders of bone density and structure, unspecified site: Secondary | ICD-10-CM | POA: Insufficient documentation

## 2019-08-31 DIAGNOSIS — Z7982 Long term (current) use of aspirin: Secondary | ICD-10-CM | POA: Insufficient documentation

## 2019-08-31 DIAGNOSIS — I252 Old myocardial infarction: Secondary | ICD-10-CM | POA: Diagnosis not present

## 2019-08-31 DIAGNOSIS — Z13820 Encounter for screening for osteoporosis: Secondary | ICD-10-CM

## 2019-08-31 DIAGNOSIS — Z90710 Acquired absence of both cervix and uterus: Secondary | ICD-10-CM | POA: Diagnosis not present

## 2019-08-31 NOTE — Progress Notes (Signed)
Pt in for yearly follow up, denies any concerns.   

## 2019-08-31 NOTE — Assessment & Plan Note (Addendum)
#   BREAST CANCER- STAGE I; ER-pos/PR neg; Her 2 Neg finished AI April 2019.  Clinically no evidence of recurrence.  Stable.  We will plan to get mammogram screening this month.  #Mild hypercalcemia calcium 10.6-September 2019.  Awaiting labs from PCPs office.  # Osteopenia bone density May 2019- T score: -1.2.   Continue Fosamax And vitamin D; bone density along with mammogram.  Stable  # DISPOSITION: We will call with results of bone density.  Reminded the patient to have the results of recent blood work from PCPs office sent over to [place]. # bilateral screening mammogram [please schedule same day] next 1 to 2 weeks.  #Follow-up in 12 months-MD CBC CMP- Dr.B

## 2019-08-31 NOTE — Progress Notes (Signed)
[place] OFFICE PROGRESS NOTE  [facility] Care Team: Frazier Richards, MD as PCP - General (Family Medicine)   SUMMARY OF ONCOLOGIC HISTORY: Oncology History Overview Note  # RIGHT BREAST CANCER- STAGE I; ER-pos/PR neg; Her 2 Neg s/p Lumpec  S/p RT; Oncotype- 23/ intermediate risk [risk of recurrence 14%]-No adj chemo; Currently on Arimidex since January 2012; march 2017- cont Arimidex; BCI-LOW benefit for extended therapy; April 2019- STOP AI.   # BMD- June 2015- osteopenia   Carcinoma of overlapping sites of right breast in female, estrogen receptor positive ([place])     INTERVAL HISTORY:  Molly  year female Mosley with above history of stage I breast cancer s/p Arimidex is here for follow-up.   Denies any new lumps or bumps.  Appetite is good with no weight loss.  No nausea vomiting.  No headaches.  No bone pain.  Review of Systems  Constitutional: Negative for chills, diaphoresis, fever, malaise/fatigue and weight loss.  HENT: Negative for nosebleeds and sore throat.   Eyes: Negative for double vision.  Respiratory: Negative for cough, hemoptysis, sputum production, shortness of breath and wheezing.   Cardiovascular: Negative for chest pain, palpitations, orthopnea and leg swelling.  Gastrointestinal: Negative for abdominal pain, blood in stool, constipation, diarrhea, heartburn, melena, nausea and vomiting.  Genitourinary: Negative for dysuria, frequency and urgency.  Musculoskeletal: Positive for back pain and joint pain.  Skin: Negative.  Negative for itching and rash.  Neurological: Negative for dizziness, tingling, focal weakness, weakness and headaches.  Endo/Heme/Allergies: Does not bruise/bleed easily.  Psychiatric/Behavioral: Negative for depression. The Mosley is not nervous/anxious and does not have insomnia.       PAST MEDICAL HISTORY :  Past Medical History:  Diagnosis Date  . Breast cancer [facility]) 2011   Right- radiation  . CAD (coronary artery  disease)   . Diabetes mellitus without complication ([place])   . HTN (hypertension)   . Hyperlipidemia   . MI (myocardial infarction) ([place])   . Personal history of radiation therapy 2011   Right breast    PAST SURGICAL HISTORY :   Past Surgical History:  Procedure Laterality Date  . ABDOMINAL HYSTERECTOMY    . BREAST BIOPSY Right 2011   Positive  . BREAST LUMPECTOMY Right   . CORONARY ARTERY BYPASS GRAFT      FAMILY HISTORY :   Family History  Problem Relation Age of Onset  . Diabetes Mother   . Hypertension Father   . Breast cancer Neg Hx     SOCIAL HISTORY:   Social History   Tobacco Use  . Smoking status: Former Smoker    Quit date: 04/30/2011    Years since quitting: 8.3  . Smokeless tobacco: Never Used  Substance Use Topics  . Alcohol use: No    Alcohol/week: 0.0 standard drinks  . Drug use: No    ALLERGIES:  is allergic to tamiflu [oseltamivir phosphate].  MEDICATIONS:  Current Outpatient Medications  Medication Sig Dispense Refill  . alendronate (FOSAMAX) 70 MG tablet Take 70 mg by mouth once a week.     Marland Kitchen amLODipine (NORVASC) 5 MG tablet Take 5 mg by mouth daily.     Marland Kitchen aspirin 81 MG chewable tablet Chew by mouth.    . carvedilol (COREG) 12.5 MG tablet Take 12.5 mg by mouth 2 (two) times daily with a meal.     . metFORMIN (GLUCOPHAGE) 1000 MG tablet Take by mouth.    . quinapril (ACCUPRIL) 40 MG tablet Take 40  mg by mouth daily.     . rosuvastatin (CRESTOR) 40 MG tablet Take 40 mg by mouth daily.      No current facility-administered medications for this visit.    PHYSICAL EXAMINATION: ECOG PERFORMANCE STATUS: 0 - Asymptomatic  BP (!) 144/74 (BP Location: Left Arm, Mosley Position: Sitting)   Pulse (!) 53   Temp (!) 96 F (35.6 C) (Tympanic)   Resp 18   Wt 178 lb (80.7 kg)   BMI 28.73 kg/m   Filed Weights   08/31/19 0952  Weight: 178 lb (80.7 kg)    Physical Exam  Constitutional: She is oriented to person, place, and time and  well-developed, well-nourished, and in no distress.  HENT:  Head: Normocephalic and atraumatic.  Mouth/Throat: Oropharynx is clear and moist. No oropharyngeal exudate.  Eyes: Pupils are equal, round, and reactive to light.  Cardiovascular: Normal rate and regular rhythm.  Pulmonary/Chest: Effort normal and breath sounds normal. No respiratory distress. She has no wheezes.  Abdominal: Soft. Bowel sounds are normal. She exhibits no distension and no mass. There is no abdominal tenderness. There is no rebound and no guarding.  Musculoskeletal:        General: No tenderness or edema. Normal range of motion.     Cervical back: Normal range of motion and neck supple.  Neurological: She is alert and oriented to person, place, and time.  Skin: Skin is warm.  Right and left BREAST exam (in the presence of nurse)- no unusual skin changes or dominant masses felt. Surgical scars noted.    Psychiatric: Affect normal.     LABORATORY DATA:  I have reviewed the data as listed    Component Value Date/Time   NA 143 10/29/2018 1005   K 3.5 10/29/2018 1005   CL 105 10/29/2018 1005   CO2 28 10/29/2018 1005   GLUCOSE 186 (H) 10/29/2018 1005   BUN 11 10/29/2018 1005   CREATININE 0.74 10/29/2018 1005   CREATININE 0.91 07/09/2013 0951   CALCIUM 10.2 10/29/2018 1005   PROT 8.0 10/29/2018 1005   PROT 8.0 07/09/2013 0951   ALBUMIN 4.4 10/29/2018 1005   ALBUMIN 4.0 07/09/2013 0951   AST 21 10/29/2018 1005   AST 20 07/09/2013 0951   ALT 14 10/29/2018 1005   ALT 21 07/09/2013 0951   ALKPHOS 55 10/29/2018 1005   ALKPHOS 65 07/09/2013 0951   BILITOT 0.5 10/29/2018 1005   BILITOT 0.4 07/09/2013 0951   GFRNONAA >60 10/29/2018 1005   GFRNONAA >60 07/09/2013 0951   GFRAA >60 10/29/2018 1005   GFRAA >60 07/09/2013 0951    No results found for: SPEP, UPEP  Lab Results  Component Value Date   WBC 4.8 10/29/2018   NEUTROABS 3.1 10/29/2018   HGB 12.6 10/29/2018   HCT 39.5 10/29/2018   MCV 91.6  10/29/2018   PLT 178 10/29/2018      Chemistry      Component Value Date/Time   NA 143 10/29/2018 1005   K 3.5 10/29/2018 1005   CL 105 10/29/2018 1005   CO2 28 10/29/2018 1005   BUN 11 10/29/2018 1005   CREATININE 0.74 10/29/2018 1005   CREATININE 0.91 07/09/2013 0951      Component Value Date/Time   CALCIUM 10.2 10/29/2018 1005   ALKPHOS 55 10/29/2018 1005   ALKPHOS 65 07/09/2013 0951   AST 21 10/29/2018 1005   AST 20 07/09/2013 0951   ALT 14 10/29/2018 1005   ALT 21 07/09/2013 0951   BILITOT 0.5  10/29/2018 1005   BILITOT 0.4 07/09/2013 0951        ASSESSMENT & PLAN:   Carcinoma of overlapping sites of right breast in female, estrogen receptor positive ([place]) # BREAST CANCER- STAGE I; ER-pos/PR neg; Her 2 Neg finished AI April 2019.  Clinically no evidence of recurrence.  Stable.  We will plan to get mammogram screening this month.  #Mild hypercalcemia calcium 10.6-September 2019.  Awaiting labs from PCPs office.  # Osteopenia bone density May 2019- T score: -1.2.   Continue Fosamax And vitamin D; bone density along with mammogram.  Stable  # DISPOSITION: We will call with results of bone density.  Reminded the Mosley to have the results of recent blood work from PCPs office sent over to [place]. # bilateral screening mammogram [please schedule same day] next 1 to 2 weeks.  #Follow-up in 12 months-MD CBC CMP- Dr.B                                                                                                                                                                                         Cammie Sickle, MD 08/31/2019 10:10 AM

## 2019-11-02 ENCOUNTER — Ambulatory Visit
Admission: RE | Admit: 2019-11-02 | Discharge: 2019-11-02 | Disposition: A | Discharge status: Home / Self Care | Payer: Medicare Other | Source: Ambulatory Visit | Attending: Internal Medicine | Admitting: Internal Medicine

## 2019-11-02 DIAGNOSIS — Z170 Estrogen receptor positive status [ER+]: Secondary | ICD-10-CM

## 2019-11-02 DIAGNOSIS — Z1231 Encounter for screening mammogram for malignant neoplasm of breast: Secondary | ICD-10-CM | POA: Diagnosis not present

## 2019-11-02 DIAGNOSIS — C50811 Malignant neoplasm of overlapping sites of right female breast: Secondary | ICD-10-CM | POA: Diagnosis not present

## 2019-11-02 DIAGNOSIS — Z853 Personal history of malignant neoplasm of breast: Secondary | ICD-10-CM | POA: Diagnosis not present

## 2019-11-02 DIAGNOSIS — Z13820 Encounter for screening for osteoporosis: Secondary | ICD-10-CM | POA: Diagnosis present

## 2019-11-02 DIAGNOSIS — M85851 Other specified disorders of bone density and structure, right thigh: Secondary | ICD-10-CM | POA: Insufficient documentation

## 2019-11-23 ENCOUNTER — Telehealth: Payer: Self-pay | Admitting: Internal Medicine

## 2019-11-23 NOTE — Telephone Encounter (Signed)
On 7/27-I tried to reach the patient to discuss the results of bone density.  H-please inform patient that bone density test appears stable; continue current medications/no changes; follow-up as planned.

## 2019-11-24 NOTE — Telephone Encounter (Signed)
Contacted patient. She was made aware of her test results. Patient gave verbal understanding of the plan of care.

## 2020-08-30 ENCOUNTER — Inpatient Hospital Stay: Payer: Medicare HMO | Attending: Internal Medicine

## 2020-08-30 ENCOUNTER — Other Ambulatory Visit: Payer: Self-pay

## 2020-08-30 ENCOUNTER — Encounter: Payer: Self-pay | Admitting: Internal Medicine

## 2020-08-30 ENCOUNTER — Inpatient Hospital Stay: Payer: Medicare HMO | Admitting: Internal Medicine

## 2020-08-30 VITALS — BP 159/84 | HR 44 | Temp 97.9°F | Resp 20 | Ht 66.0 in | Wt 178.0 lb

## 2020-08-30 DIAGNOSIS — Z79899 Other long term (current) drug therapy: Secondary | ICD-10-CM | POA: Insufficient documentation

## 2020-08-30 DIAGNOSIS — I2510 Atherosclerotic heart disease of native coronary artery without angina pectoris: Secondary | ICD-10-CM | POA: Insufficient documentation

## 2020-08-30 DIAGNOSIS — Z7984 Long term (current) use of oral hypoglycemic drugs: Secondary | ICD-10-CM | POA: Diagnosis not present

## 2020-08-30 DIAGNOSIS — C50811 Malignant neoplasm of overlapping sites of right female breast: Secondary | ICD-10-CM | POA: Diagnosis not present

## 2020-08-30 DIAGNOSIS — E119 Type 2 diabetes mellitus without complications: Secondary | ICD-10-CM | POA: Diagnosis not present

## 2020-08-30 DIAGNOSIS — I252 Old myocardial infarction: Secondary | ICD-10-CM | POA: Diagnosis not present

## 2020-08-30 DIAGNOSIS — M8580 Other specified disorders of bone density and structure, unspecified site: Secondary | ICD-10-CM | POA: Insufficient documentation

## 2020-08-30 DIAGNOSIS — Z87891 Personal history of nicotine dependence: Secondary | ICD-10-CM | POA: Diagnosis not present

## 2020-08-30 DIAGNOSIS — Z170 Estrogen receptor positive status [ER+]: Secondary | ICD-10-CM | POA: Diagnosis not present

## 2020-08-30 DIAGNOSIS — Z7982 Long term (current) use of aspirin: Secondary | ICD-10-CM | POA: Insufficient documentation

## 2020-08-30 DIAGNOSIS — I10 Essential (primary) hypertension: Secondary | ICD-10-CM | POA: Diagnosis not present

## 2020-08-30 DIAGNOSIS — Z923 Personal history of irradiation: Secondary | ICD-10-CM | POA: Diagnosis not present

## 2020-08-30 LAB — CBC WITH DIFFERENTIAL/PLATELET
Abs Immature Granulocytes: 0.01 10*3/uL (ref 0.00–0.07)
Basophils Absolute: 0 10*3/uL (ref 0.0–0.1)
Basophils Relative: 1 %
Eosinophils Absolute: 0.4 10*3/uL (ref 0.0–0.5)
Eosinophils Relative: 6 %
HCT: 38.1 % (ref 36.0–46.0)
Hemoglobin: 12.5 g/dL (ref 12.0–15.0)
Immature Granulocytes: 0 %
Lymphocytes Relative: 24 %
Lymphs Abs: 1.3 10*3/uL (ref 0.7–4.0)
MCH: 30.1 pg (ref 26.0–34.0)
MCHC: 32.8 g/dL (ref 30.0–36.0)
MCV: 91.8 fL (ref 80.0–100.0)
Monocytes Absolute: 0.4 10*3/uL (ref 0.1–1.0)
Monocytes Relative: 7 %
Neutro Abs: 3.4 10*3/uL (ref 1.7–7.7)
Neutrophils Relative %: 62 %
Platelets: 156 10*3/uL (ref 150–400)
RBC: 4.15 MIL/uL (ref 3.87–5.11)
RDW: 13 % (ref 11.5–15.5)
WBC: 5.5 10*3/uL (ref 4.0–10.5)
nRBC: 0 % (ref 0.0–0.2)

## 2020-08-30 LAB — COMPREHENSIVE METABOLIC PANEL
ALT: 14 U/L (ref 0–44)
AST: 25 U/L (ref 15–41)
Albumin: 4.2 g/dL (ref 3.5–5.0)
Alkaline Phosphatase: 50 U/L (ref 38–126)
Anion gap: 11 (ref 5–15)
BUN: 10 mg/dL (ref 8–23)
CO2: 26 mmol/L (ref 22–32)
Calcium: 10.3 mg/dL (ref 8.9–10.3)
Chloride: 106 mmol/L (ref 98–111)
Creatinine, Ser: 0.71 mg/dL (ref 0.44–1.00)
GFR, Estimated: >60 mL/min (ref >60)
Glucose, Bld: 109 mg/dL — ABNORMAL HIGH (ref 70–99)
Potassium: 3.9 mmol/L (ref 3.5–5.1)
Sodium: 143 mmol/L (ref 135–145)
Total Bilirubin: 0.6 mg/dL (ref 0.3–1.2)
Total Protein: 7.4 g/dL (ref 6.5–8.1)

## 2020-08-30 NOTE — Progress Notes (Signed)
cma Chaperoned provider with Breast Exam

## 2020-08-30 NOTE — Progress Notes (Signed)
[place] OFFICE PROGRESS NOTE  Patient Care Team: Frazier Richards, MD as PCP - General (Family Medicine)   SUMMARY OF ONCOLOGIC HISTORY: Oncology History Overview Note  # RIGHT BREAST CANCER- STAGE I; ER-pos/PR neg; Her 2 Neg s/p Lumpec  S/p RT; Oncotype- 23/ intermediate risk [risk of recurrence 14%]-No adj chemo; Currently on Arimidex since January 2012; march 2017- cont Arimidex; BCI-LOW benefit for extended therapy; April 2019- STOP AI.   # BMD- June 2015- osteopenia   Carcinoma of overlapping sites of right breast in female, estrogen receptor positive ([place])     INTERVAL HISTORY:  70  year female patient with above history of stage I breast cancer s/p Arimidex is here for follow-up.   Patient denies any new lumps or bumps.  Appetite reduced.  No weight loss.  No nausea vomiting. no Headaches.  No bone pain.   Review of Systems  Constitutional: Negative for chills, diaphoresis, fever, malaise/fatigue and weight loss.  HENT: Negative for nosebleeds and sore throat.   Eyes: Negative for double vision.  Respiratory: Negative for cough, hemoptysis, sputum production, shortness of breath and wheezing.   Cardiovascular: Negative for chest pain, palpitations, orthopnea and leg swelling.  Gastrointestinal: Negative for abdominal pain, blood in stool, constipation, diarrhea, heartburn, melena, nausea and vomiting.  Genitourinary: Negative for dysuria, frequency and urgency.  Musculoskeletal: Positive for back pain and joint pain.  Skin: Negative.  Negative for itching and rash.  Neurological: Negative for dizziness, tingling, focal weakness, weakness and headaches.  Endo/Heme/Allergies: Does not bruise/bleed easily.  Psychiatric/Behavioral: Negative for depression. The patient is not nervous/anxious and does not have insomnia.       PAST MEDICAL HISTORY :  Past Medical History:  Diagnosis Date  . Breast cancer [facility]) 2011   Right- radiation  . CAD (coronary  artery disease)   . Diabetes mellitus without complication ([place])   . HTN (hypertension)   . Hyperlipidemia   . MI (myocardial infarction) ([place])   . Personal history of radiation therapy 2011   Right breast    PAST SURGICAL HISTORY :   Past Surgical History:  Procedure Laterality Date  . ABDOMINAL HYSTERECTOMY    . BREAST BIOPSY Right 2011   Positive  . BREAST LUMPECTOMY Right   . CORONARY ARTERY BYPASS GRAFT      FAMILY HISTORY :   Family History  Problem Relation Age of Onset  . Diabetes Mother   . Hypertension Father   . Breast cancer Neg Hx     SOCIAL HISTORY:   Social History   Tobacco Use  . Smoking status: Former Smoker    Quit date: 04/30/2011    Years since quitting: 9.3  . Smokeless tobacco: Never Used  Substance Use Topics  . Alcohol use: No    Alcohol/week: 0.0 standard drinks  . Drug use: No    ALLERGIES:  is allergic to tamiflu [oseltamivir phosphate].  MEDICATIONS:  Current Outpatient Medications  Medication Sig Dispense Refill  . alendronate (FOSAMAX) 70 MG tablet Take 70 mg by mouth once a week.     Marland Kitchen amLODipine (NORVASC) 5 MG tablet Take 5 mg by mouth daily.     Marland Kitchen aspirin 81 MG chewable tablet Chew by mouth.    Marland Kitchen CALCIUM 600/VITAMIN D 600-400 MG-UNIT TABS Take 1 tablet by mouth daily.    . carvedilol (COREG) 12.5 MG tablet Take 12.5 mg by mouth 2 (two) times daily with a meal.     . metFORMIN (GLUCOPHAGE) 1000  MG tablet Take by mouth.    . quinapril (ACCUPRIL) 40 MG tablet Take 40 mg by mouth daily.     . rosuvastatin (CRESTOR) 40 MG tablet Take 40 mg by mouth daily.      No current facility-administered medications for this visit.    PHYSICAL EXAMINATION: ECOG PERFORMANCE STATUS: 0 - Asymptomatic  BP (!) 159/84   Pulse (!) 44   Temp 97.9 F (36.6 C) (Tympanic)   Resp 20   Ht 5\' 6"  (1.676 m)   Wt 178 lb (80.7 kg)   BMI 28.73 kg/m   Filed Weights   08/30/20 1038  Weight: 178 lb (80.7 kg)    Physical Exam HENT:     Head:  Normocephalic and atraumatic.     Mouth/Throat:     Pharynx: No oropharyngeal exudate.  Eyes:     Pupils: Pupils are equal, round, and reactive to light.  Cardiovascular:     Rate and Rhythm: Normal rate and regular rhythm.  Pulmonary:     Effort: Pulmonary effort is normal. No respiratory distress.     Breath sounds: Normal breath sounds. No wheezing.  Abdominal:     General: Bowel sounds are normal. There is no distension.     Palpations: Abdomen is soft. There is no mass.     Tenderness: There is no abdominal tenderness. There is no guarding or rebound.  Musculoskeletal:        General: No tenderness. Normal range of motion.     Cervical back: Normal range of motion and neck supple.  Skin:    General: Skin is warm.     Comments: Right and left BREAST exam (in the presence of nurse)- no unusual skin changes or dominant masses felt. Surgical scars noted.    Neurological:     Mental Status: She is alert and oriented to person, place, and time.  Psychiatric:        Mood and Affect: Affect normal.      LABORATORY DATA:  I have reviewed the data as listed    Component Value Date/Time   NA 143 08/30/2020 1010   K 3.9 08/30/2020 1010   CL 106 08/30/2020 1010   CO2 26 08/30/2020 1010   GLUCOSE 109 (H) 08/30/2020 1010   BUN 10 08/30/2020 1010   CREATININE 0.71 08/30/2020 1010   CREATININE 0.91 07/09/2013 0951   CALCIUM 10.3 08/30/2020 1010   PROT 7.4 08/30/2020 1010   PROT 8.0 07/09/2013 0951   ALBUMIN 4.2 08/30/2020 1010   ALBUMIN 4.0 07/09/2013 0951   AST 25 08/30/2020 1010   AST 20 07/09/2013 0951   ALT 14 08/30/2020 1010   ALT 21 07/09/2013 0951   ALKPHOS 50 08/30/2020 1010   ALKPHOS 65 07/09/2013 0951   BILITOT 0.6 08/30/2020 1010   BILITOT 0.4 07/09/2013 0951   GFRNONAA >60 08/30/2020 1010   GFRNONAA >60 07/09/2013 0951   GFRAA >60 10/29/2018 1005   GFRAA >60 07/09/2013 0951    No results found for: SPEP, UPEP  Lab Results  Component Value Date   WBC 5.5  08/30/2020   NEUTROABS 3.4 08/30/2020   HGB 12.5 08/30/2020   HCT 38.1 08/30/2020   MCV 91.8 08/30/2020   PLT 156 08/30/2020      Chemistry      Component Value Date/Time   NA 143 08/30/2020 1010   K 3.9 08/30/2020 1010   CL 106 08/30/2020 1010   CO2 26 08/30/2020 1010   BUN 10 08/30/2020 1010  CREATININE 0.71 08/30/2020 1010   CREATININE 0.91 07/09/2013 0951      Component Value Date/Time   CALCIUM 10.3 08/30/2020 1010   ALKPHOS 50 08/30/2020 1010   ALKPHOS 65 07/09/2013 0951   AST 25 08/30/2020 1010   AST 20 07/09/2013 0951   ALT 14 08/30/2020 1010   ALT 21 07/09/2013 0951   BILITOT 0.6 08/30/2020 1010   BILITOT 0.4 07/09/2013 0951        ASSESSMENT & PLAN:   Carcinoma of overlapping sites of right breast in female, estrogen receptor positive ([place]) # BREAST CANCER- STAGE I; ER-pos/PR neg; Her 2 Neg finished AI April 2019.  Clinically no evidence of recurrence.  Stable.  # Osteopenia bone density- July 2021- BMD T-score- of -1.2.  Patient on Fosamax.  Reviewed the bone density.  Stable.  #With regards to follow-up-I think is reasonable for the patient to follow-up with PCP; annual breast exam/annual screening mammograms.  Would not recommend any breast cancer specific blood work.  Patient is in agreement with the plan.  She will call us for any questions or concerns.  # DISPOSITION:  #Follow-up as needed-- Dr.B  Cc; Dr.Adamo                                                                                                                                                                                         Cammie Sickle, MD 08/30/2020 12:57 PM

## 2020-08-30 NOTE — Assessment & Plan Note (Addendum)
#   BREAST CANCER- STAGE I; ER-pos/PR neg; Her 2 Neg finished AI April 2019.  Clinically no evidence of recurrence.  Stable.  # Osteopenia bone density- July 2021- BMD T-score- of -1.2.  Patient on Fosamax.  Reviewed the bone density.  Stable.  #With regards to follow-up-I think is reasonable for the patient to follow-up with PCP; annual breast exam/annual screening mammograms.  Would not recommend any breast cancer specific blood work.  Patient is in agreement with the plan.  She will call us for any questions or concerns.  # DISPOSITION:  #Follow-up as needed-- Dr.B  Cc; Dr.Adamo

## 2020-11-24 ENCOUNTER — Other Ambulatory Visit: Payer: Self-pay | Admitting: Family Medicine

## 2020-11-24 DIAGNOSIS — Z1231 Encounter for screening mammogram for malignant neoplasm of breast: Secondary | ICD-10-CM

## 2020-12-04 ENCOUNTER — Other Ambulatory Visit: Payer: Self-pay

## 2020-12-04 ENCOUNTER — Ambulatory Visit
Admission: RE | Admit: 2020-12-04 | Discharge: 2020-12-04 | Disposition: A | Discharge status: Home / Self Care | Payer: Medicare HMO | Source: Ambulatory Visit | Attending: Family Medicine | Admitting: Family Medicine

## 2020-12-04 DIAGNOSIS — Z1231 Encounter for screening mammogram for malignant neoplasm of breast: Secondary | ICD-10-CM | POA: Diagnosis present

## 2021-01-16 ENCOUNTER — Ambulatory Visit
Admission: RE | Admit: 2021-01-16 | Discharge: 2021-01-16 | Disposition: A | Discharge status: Home / Self Care | Payer: Medicare HMO | Attending: Gastroenterology | Admitting: Gastroenterology

## 2021-01-16 ENCOUNTER — Ambulatory Visit: Payer: Medicare HMO | Admitting: Registered Nurse

## 2021-01-16 ENCOUNTER — Other Ambulatory Visit: Payer: Self-pay

## 2021-01-16 ENCOUNTER — Encounter
Admission: RE | Disposition: A | Discharge status: Home / Self Care | Payer: Self-pay | Source: Home / Self Care | Attending: Gastroenterology

## 2021-01-16 ENCOUNTER — Encounter: Payer: Self-pay | Admitting: *Deleted

## 2021-01-16 DIAGNOSIS — E785 Hyperlipidemia, unspecified: Secondary | ICD-10-CM | POA: Diagnosis not present

## 2021-01-16 DIAGNOSIS — Z79899 Other long term (current) drug therapy: Secondary | ICD-10-CM | POA: Insufficient documentation

## 2021-01-16 DIAGNOSIS — Z7982 Long term (current) use of aspirin: Secondary | ICD-10-CM | POA: Insufficient documentation

## 2021-01-16 DIAGNOSIS — I2510 Atherosclerotic heart disease of native coronary artery without angina pectoris: Secondary | ICD-10-CM | POA: Diagnosis not present

## 2021-01-16 DIAGNOSIS — I252 Old myocardial infarction: Secondary | ICD-10-CM | POA: Diagnosis not present

## 2021-01-16 DIAGNOSIS — E119 Type 2 diabetes mellitus without complications: Secondary | ICD-10-CM | POA: Insufficient documentation

## 2021-01-16 DIAGNOSIS — Z887 Allergy status to serum and vaccine status: Secondary | ICD-10-CM | POA: Diagnosis not present

## 2021-01-16 DIAGNOSIS — Z7983 Long term (current) use of bisphosphonates: Secondary | ICD-10-CM | POA: Insufficient documentation

## 2021-01-16 DIAGNOSIS — Z1211 Encounter for screening for malignant neoplasm of colon: Secondary | ICD-10-CM | POA: Insufficient documentation

## 2021-01-16 DIAGNOSIS — I10 Essential (primary) hypertension: Secondary | ICD-10-CM | POA: Diagnosis not present

## 2021-01-16 DIAGNOSIS — Z7984 Long term (current) use of oral hypoglycemic drugs: Secondary | ICD-10-CM | POA: Diagnosis not present

## 2021-01-16 HISTORY — PX: COLONOSCOPY WITH PROPOFOL: SHX5780

## 2021-01-16 LAB — GLUCOSE, CAPILLARY: Glucose-Capillary: 108 mg/dL — ABNORMAL HIGH (ref 70–99)

## 2021-01-16 SURGERY — COLONOSCOPY WITH PROPOFOL
Anesthesia: General

## 2021-01-16 MED ORDER — PROPOFOL 10 MG/ML IV BOLUS
INTRAVENOUS | Status: AC
  Filled 2021-01-16: qty 40

## 2021-01-16 MED ORDER — SODIUM CHLORIDE 0.9 % IV SOLN: INTRAVENOUS | Status: DC

## 2021-01-16 MED ORDER — PROPOFOL 10 MG/ML IV BOLUS
INTRAVENOUS | Status: DC | PRN
  Administered 2021-01-16: 80 mg via INTRAVENOUS
  Administered 2021-01-16: 100 ug/kg/min via INTRAVENOUS

## 2021-01-16 NOTE — Anesthesia Postprocedure Evaluation (Signed)
Anesthesia Post Note  Patient: Alawna Graybeal  Procedure(s) Performed: COLONOSCOPY WITH PROPOFOL  Patient location during evaluation: Endoscopy Anesthesia Type: General Level of consciousness: awake and alert Pain management: pain level controlled Vital Signs Assessment: post-procedure vital signs reviewed and stable Respiratory status: spontaneous breathing, nonlabored ventilation, respiratory function stable and patient connected to nasal cannula oxygen Cardiovascular status: blood pressure returned to baseline and stable Postop Assessment: no apparent nausea or vomiting Anesthetic complications: no   No notable events documented.   Last Vitals:  Vitals:   01/16/21 0859 01/16/21 0909  BP: 134/63 (!) 159/72  Pulse: (!) 49 (!) 47  Resp: 18 17  Temp:    SpO2: 99% 100%    Last Pain:  Vitals:   01/16/21 0849  TempSrc: Temporal  PainSc: Asleep                 Martha Clan

## 2021-01-16 NOTE — Op Note (Signed)
[facility]astroenter[facility] Patient Name: Molly Mosley Procedure Date: 01/16/2021 8:18 AM MRN: [id number] Account #: [id number] Date of Birth: 21-Oct-1950 Admit Type: Outpatient Age: 70 Room: [facility] ENDO ROOM 1 Gender: Female Note Status: Finalized Instrument Name: Jasper Riling [id number] Procedure:             Colonoscopy Indications:           Screening for colorectal malignant neoplasm Providers:             Andrey Farmer MD, MD Referring MD:          Hattie Perch. Sherril Cong (Referring MD) Medicines:             Monitored Anesthesia Care Complications:         No immediate complications. Procedure:             Pre-Anesthesia Assessment:                        - Prior to the procedure, a History and Physical was                         performed, and patient medications and allergies were                         reviewed. The patient is competent. The risks and                         benefits of the procedure and the sedation options and                         risks were discussed with the patient. All questions                         were answered and informed consent was obtained.                         Patient identification and proposed procedure were                         verified by the physician, the nurse, the anesthetist                         and the technician in the endoscopy suite. Mental                         Status Examination: alert and oriented. Airway                         Examination: normal oropharyngeal airway and neck                         mobility. Respiratory Examination: clear to                         auscultation. CV Examination: normal. Prophylactic                         Antibiotics: The patient does not require prophylactic  antibiotics. Prior Anticoagulants: The patient has                         taken no previous anticoagulant or antiplatelet                         agents. ASA Grade Assessment: II - A  patient with mild                         systemic disease. After reviewing the risks and                         benefits, the patient was deemed in satisfactory                         condition to undergo the procedure. The anesthesia                         plan was to use monitored anesthesia care (MAC).                         Immediately prior to administration of medications,                         the patient was re-assessed for adequacy to receive                         sedatives. The heart rate, respiratory rate, oxygen                         saturations, blood pressure, adequacy of pulmonary                         ventilation, and response to care were monitored                         throughout the procedure. The physical status of the                         patient was re-assessed after the procedure.                        After obtaining informed consent, the colonoscope was                         passed under direct vision. Throughout the procedure,                         the patient's blood pressure, pulse, and oxygen                         saturations were monitored continuously. The                         Colonoscope was introduced through the anus and                         advanced to the the cecum, identified by appendiceal  orifice and ileocecal valve. The colonoscopy was                         performed without difficulty. The patient tolerated                         the procedure well. The quality of the bowel                         preparation was fair. Findings:      The perianal and digital rectal examinations were normal.      The entire examined colon appeared normal on direct and retroflexion       views. Impression:            - Preparation of the colon was fair.                        - The entire examined colon is normal on direct and                         retroflexion views.                        - No specimens  collected. Recommendation:        - Discharge patient to home.                        - Resume previous diet.                        - Continue present medications.                        - Repeat colonoscopy in 1 year because the bowel                         preparation was suboptimal.                        - Return to referring physician as previously                         scheduled. Procedure Code(s):     --- Professional ---                        G8185, Colorectal cancer screening; colonoscopy on                         individual not meeting criteria for high risk Diagnosis Code(s):     --- Professional ---                        Z12.11, Encounter for screening for malignant neoplasm                         of colon CPT copyright 2019 American Medical Association. All rights reserved. The codes documented in this report are preliminary and upon coder review may  be revised to meet current compliance requirements. Andrey Farmer MD, MD 01/16/2021 8:47:10 AM Number of Addenda: 0 Note Initiated On: 01/16/2021 8:18 AM  Scope Withdrawal Time: 0 hours 7 minutes 13 seconds  Total Procedure Duration: 0 hours 15 minutes 10 seconds  Estimated Blood Loss:  Estimated blood loss: none.      [facility]

## 2021-01-16 NOTE — Interval H&P Note (Signed)
History and Physical Interval Note:  01/16/2021 8:16 AM  [place]  has presented today for surgery, with the diagnosis of SCREEN.  The various methods of treatment have been discussed with the patient and family. After consideration of risks, benefits and other options for treatment, the patient has consented to  Procedure(s) with comments: COLONOSCOPY WITH PROPOFOL (N/A) - DM as a surgical intervention.  The patient's history has been reviewed, patient examined, no change in status, stable for surgery.  I have reviewed the patient's chart and labs.  Questions were answered to the patient's satisfaction.     Lesly Rubenstein  Ok to proceed with colonoscopy

## 2021-01-16 NOTE — Anesthesia Preprocedure Evaluation (Signed)
Anesthesia Evaluation  Patient identified by MRN, date of birth, ID band Patient awake    Reviewed: Allergy & Precautions, H&P , NPO status , Patient's Chart, lab work & pertinent test results, reviewed documented beta blocker date and time   History of Anesthesia Complications Negative for: history of anesthetic complications  Airway Mallampati: I  TM Distance: >3 FB Neck ROM: full    Dental  (+) Dental Advidsory Given, Edentulous Upper, Edentulous Lower   Pulmonary neg pulmonary ROS, former smoker,    Pulmonary exam normal breath sounds clear to auscultation       Cardiovascular Exercise Tolerance: Good hypertension, (-) angina+ CAD, + Past MI and + Cardiac Stents  Normal cardiovascular exam(-) dysrhythmias (-) Valvular Problems/Murmurs Rhythm:regular Rate:Normal     Neuro/Psych negative neurological ROS  negative psych ROS   GI/Hepatic negative GI ROS, Neg liver ROS,   Endo/Other  diabetes, Type 2  Renal/GU negative Renal ROS  negative genitourinary   Musculoskeletal   Abdominal   Peds  Hematology negative hematology ROS (+)   Anesthesia Other Findings Past Medical History: 2011: Breast cancer ([place])     Comment:  Right- radiation No date: CAD (coronary artery disease) No date: Diabetes mellitus without complication (HCC) No date: HTN (hypertension) No date: Hyperlipidemia No date: MI (myocardial infarction) ([place]) 2011: Personal history of radiation therapy     Comment:  Right breast   Reproductive/Obstetrics negative OB ROS                             Anesthesia Physical Anesthesia Plan  ASA: 3  Anesthesia Plan: General   Post-op Pain Management:    Induction: Intravenous  PONV Risk Score and Plan: 3 and TIVA and Propofol infusion  Airway Management Planned: Natural Airway and Nasal Cannula  Additional Equipment:   Intra-op Plan:   Post-operative Plan:    Informed Consent: I have reviewed the patients History and Physical, chart, labs and discussed the procedure including the risks, benefits and alternatives for the proposed anesthesia with the patient or authorized representative who has indicated his/her understanding and acceptance.     Dental Advisory Given  Plan Discussed with: Anesthesiologist, CRNA and Surgeon  Anesthesia Plan Comments:         Anesthesia Quick Evaluation

## 2021-01-16 NOTE — H&P (Signed)
Outpatient short stay form Pre-procedure 01/16/2021  Molly Rubenstein, MD  Primary Physician: Frazier Richards, MD  Reason for visit:  Screening  History of present illness:   70 y/o lady with history of hypertension, DM II, and HLD here for screening colonoscopy had normal colonoscopy in 2011. No significant abdominal surgeries. No blood thinners. No family history of GI malignancies.    Current Facility-Administered Medications:    0.9 %  sodium chloride infusion, , Intravenous, Continuous, Darvin Dials, Hilton Cork, MD, Last Rate: 20 mL/hr at 01/16/21 0756, New Bag at 01/16/21 0756  Medications Prior to Admission  Medication Sig Dispense Refill Last Dose   alendronate (FOSAMAX) 70 MG tablet Take 70 mg by mouth once a week.    Past Week   amLODipine (NORVASC) 5 MG tablet Take 5 mg by mouth daily.    01/15/2021   aspirin 81 MG chewable tablet Chew by mouth.   Past Week   CALCIUM 600/VITAMIN D 600-400 MG-UNIT TABS Take 1 tablet by mouth daily.   Past Week   carvedilol (COREG) 12.5 MG tablet Take 12.5 mg by mouth 2 (two) times daily with a meal.    01/16/2021   metFORMIN (GLUCOPHAGE) 1000 MG tablet Take by mouth.   Past Week   quinapril (ACCUPRIL) 40 MG tablet Take 40 mg by mouth daily.    01/16/2021   rosuvastatin (CRESTOR) 40 MG tablet Take 40 mg by mouth daily.    Past Week     Allergies  Allergen Reactions   Tamiflu [Oseltamivir Phosphate] Palpitations and Other (See Comments)    Chest pain     Past Medical History:  Diagnosis Date   Breast cancer ([place]) 2011   Right- radiation   CAD (coronary artery disease)    Diabetes mellitus without complication ([place])    HTN (hypertension)    Hyperlipidemia    MI (myocardial infarction) ([place])    Personal history of radiation therapy 2011   Right breast    Review of systems:  Otherwise negative.    Physical Exam  Gen: Alert, oriented. Appears stated age.  HEENT: PERRLA. Lungs: No respiratory distress CV: RRR Abd: soft, benign, no  masses Ext: No edema    Planned procedures: Proceed with colonoscopy. The patient understands the nature of the planned procedure, indications, risks, alternatives and potential complications including but not limited to bleeding, infection, perforation, damage to internal organs and possible oversedation/side effects from anesthesia. The patient agrees and gives consent to proceed.  Please refer to procedure notes for findings, recommendations and patient disposition/instructions.     Molly Rubenstein, MD [facility]

## 2021-01-16 NOTE — Transfer of Care (Signed)
Immediate Anesthesia Transfer of Care Note  Patient: Molly Mosley  Procedure(s) Performed: COLONOSCOPY WITH PROPOFOL  Patient Location: PACU and Endoscopy Unit  Anesthesia Type:General  Level of Consciousness: drowsy  Airway & Oxygen Therapy: Patient Spontanous Breathing  Post-op Assessment: Report given to RN  Post vital signs: stable  Last Vitals:  Vitals Value Taken Time  BP 111/62 01/16/21 0849  Temp 35.6 C 01/16/21 0849  Pulse 49 01/16/21 0849  Resp 15 01/16/21 0849  SpO2 99 % 01/16/21 0849    Last Pain:  Vitals:   01/16/21 0849  TempSrc: Temporal  PainSc:          Complications: No notable events documented.

## 2021-01-17 ENCOUNTER — Encounter: Payer: Self-pay | Admitting: Gastroenterology

## 2021-10-03 ENCOUNTER — Other Ambulatory Visit: Payer: Self-pay | Admitting: Family Medicine

## 2021-10-03 DIAGNOSIS — Z1231 Encounter for screening mammogram for malignant neoplasm of breast: Secondary | ICD-10-CM

## 2021-12-11 ENCOUNTER — Ambulatory Visit
Admission: RE | Admit: 2021-12-11 | Discharge: 2021-12-11 | Disposition: A | Discharge status: Home / Self Care | Payer: Medicare PPO | Source: Ambulatory Visit | Attending: Family Medicine | Admitting: Family Medicine

## 2021-12-11 DIAGNOSIS — Z1231 Encounter for screening mammogram for malignant neoplasm of breast: Secondary | ICD-10-CM | POA: Insufficient documentation

## 2022-04-08 IMAGING — MG MM DIGITAL SCREENING BILAT W/ TOMO AND CAD
8 series · 8 of 24 positions shown · non-contrast
Comparison: Previous exam(s).

CLINICAL DATA: Screening.

EXAM:
DIGITAL SCREENING BILATERAL MAMMOGRAM WITH TOMOSYNTHESIS AND CAD
TECHNIQUE: Bilateral screening digital craniocaudal and mediolateral oblique
mammograms were obtained. Bilateral screening digital breast
tomosynthesis was performed. The images were evaluated with
computer-aided detection.

[R CC synth-2D]
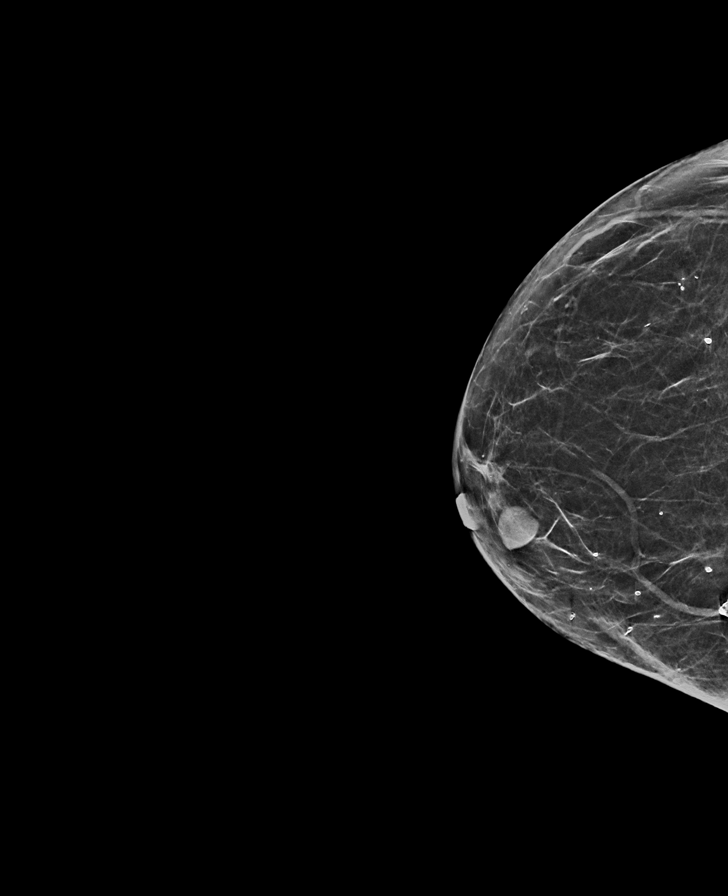

[L CC synth-2D]
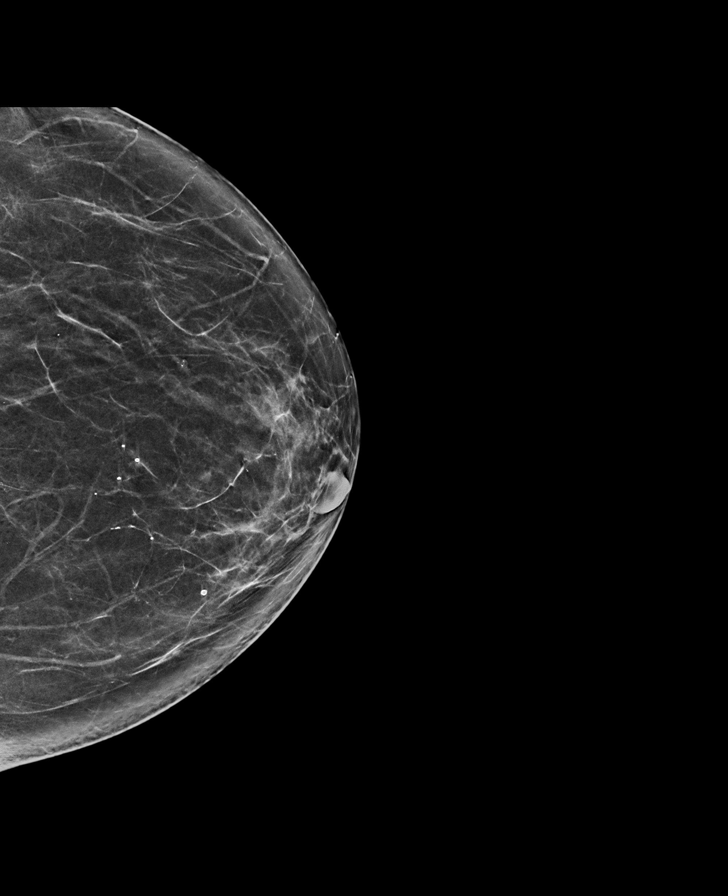

[L MLO synth-2D]
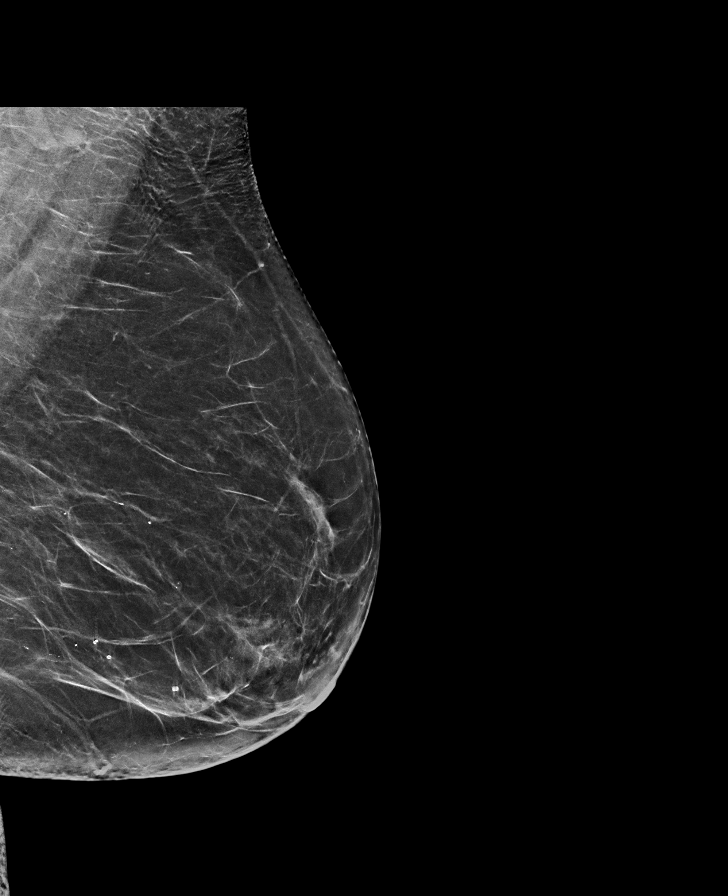

[R MLO synth-2D]
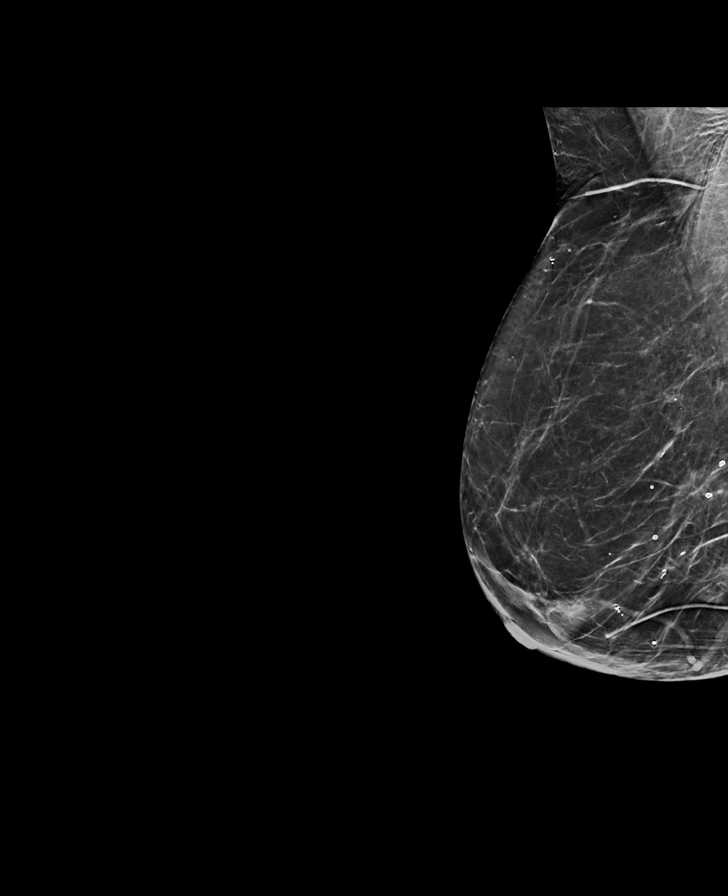

[R CC tomo · tomo slice 27/54.0]
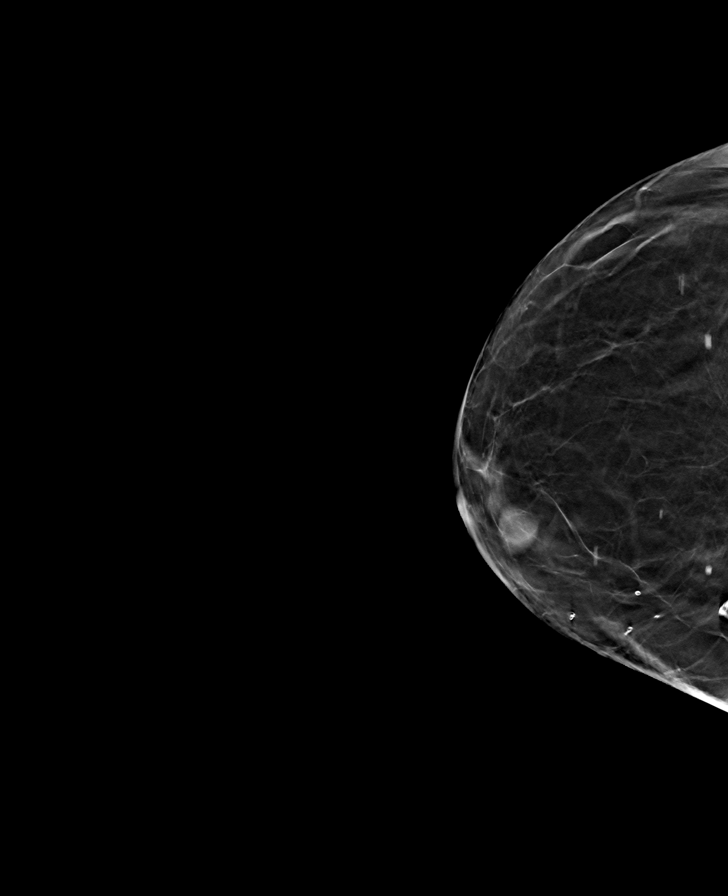

[L CC tomo · tomo slice 35/69.0]
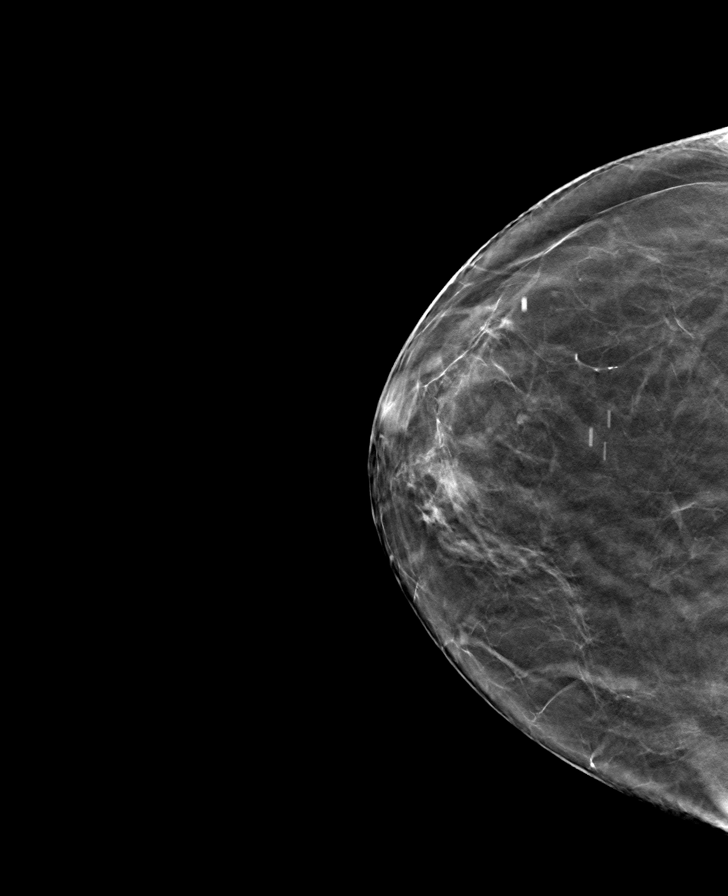

[R MLO tomo · tomo slice 32/63.0]
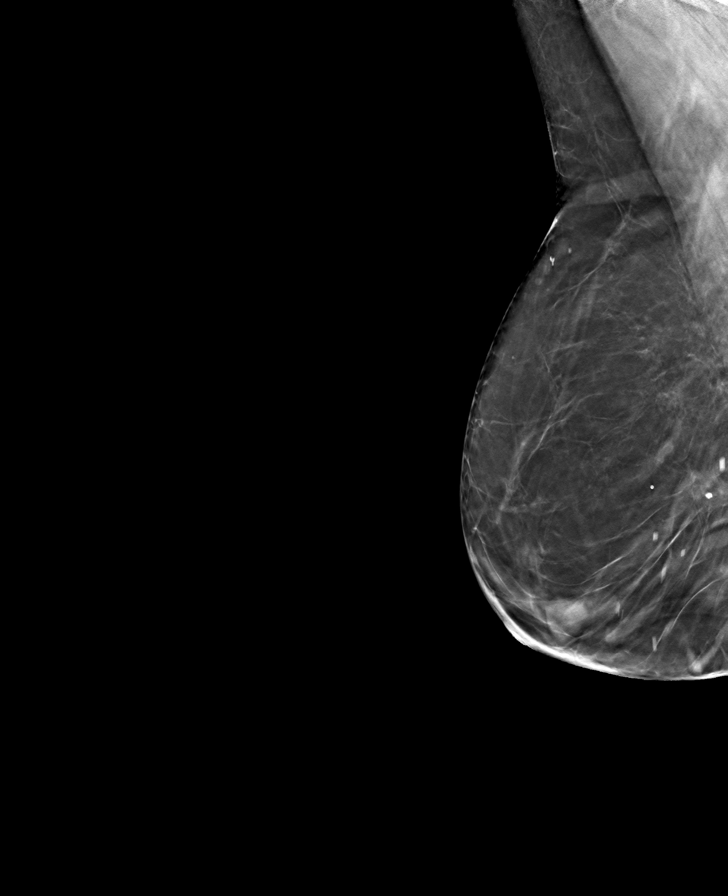

[L MLO tomo · tomo slice 37/73.0]
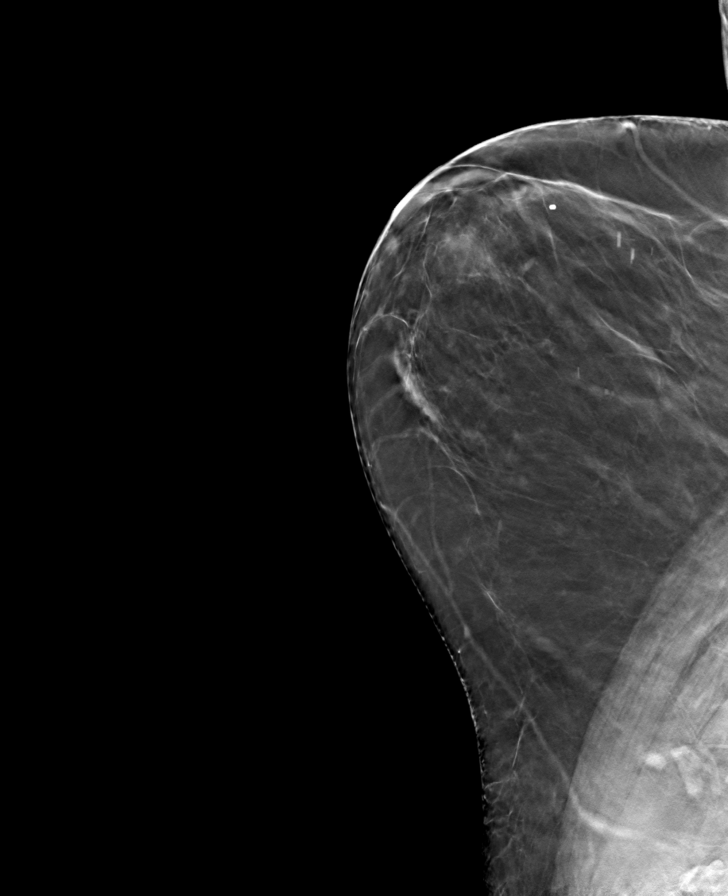

[8 of 24 positions shown; findings below may reference images not displayed]

ACR Breast Density Category b: There are scattered areas of
fibroglandular density.
FINDINGS: There are no findings suspicious for malignancy.
IMPRESSION: No mammographic evidence of malignancy. A result letter of this
screening mammogram will be mailed directly to the patient.

RECOMMENDATION:
Screening mammogram in one year. (Code:51-O-LD2)

BI-RADS CATEGORY  1: Negative.

## 2022-08-15 DIAGNOSIS — R9430 Abnormal result of cardiovascular function study, unspecified: Secondary | ICD-10-CM

## 2022-08-20 ENCOUNTER — Encounter: Payer: Self-pay | Admitting: Internal Medicine

## 2022-08-20 ENCOUNTER — Other Ambulatory Visit: Payer: Self-pay

## 2022-08-20 ENCOUNTER — Encounter
Admission: RE | Disposition: A | Discharge status: Home / Self Care | Payer: Self-pay | Source: Home / Self Care | Attending: Internal Medicine

## 2022-08-20 ENCOUNTER — Ambulatory Visit
Admission: RE | Admit: 2022-08-20 | Discharge: 2022-08-20 | Disposition: A | Discharge status: Home / Self Care | Payer: Medicare PPO | Attending: Internal Medicine | Admitting: Internal Medicine

## 2022-08-20 DIAGNOSIS — I2582 Chronic total occlusion of coronary artery: Secondary | ICD-10-CM | POA: Diagnosis not present

## 2022-08-20 DIAGNOSIS — I2510 Atherosclerotic heart disease of native coronary artery without angina pectoris: Secondary | ICD-10-CM | POA: Insufficient documentation

## 2022-08-20 DIAGNOSIS — R9430 Abnormal result of cardiovascular function study, unspecified: Secondary | ICD-10-CM | POA: Diagnosis present

## 2022-08-20 HISTORY — PX: LEFT HEART CATH AND CORS/GRAFTS ANGIOGRAPHY: CATH118250

## 2022-08-20 LAB — GLUCOSE, CAPILLARY: Glucose-Capillary: 117 mg/dL — ABNORMAL HIGH (ref 70–99)

## 2022-08-20 SURGERY — LEFT HEART CATH AND CORS/GRAFTS ANGIOGRAPHY
Anesthesia: Moderate Sedation

## 2022-08-20 MED ORDER — SODIUM CHLORIDE 0.9 % IV SOLN: 250.0000 mL | INTRAVENOUS | Status: DC | PRN

## 2022-08-20 MED ORDER — HYDRALAZINE HCL 20 MG/ML IJ SOLN
INTRAMUSCULAR | Status: AC
  Filled 2022-08-20: qty 1

## 2022-08-20 MED ORDER — FENTANYL CITRATE (PF) 100 MCG/2ML IJ SOLN
INTRAMUSCULAR | Status: DC | PRN
  Administered 2022-08-20: 25 ug via INTRAVENOUS

## 2022-08-20 MED ORDER — SODIUM CHLORIDE 0.9 % WEIGHT BASED INFUSION: 1.0000 mL/kg/h | INTRAVENOUS | Status: DC

## 2022-08-20 MED ORDER — HEPARIN (PORCINE) IN NACL 1000-0.9 UT/500ML-% IV SOLN
INTRAVENOUS | Status: AC
  Filled 2022-08-20: qty 1000

## 2022-08-20 MED ORDER — ACETAMINOPHEN 325 MG PO TABS: 650.0000 mg | ORAL_TABLET | ORAL | Status: DC | PRN

## 2022-08-20 MED ORDER — HEPARIN (PORCINE) IN NACL 1000-0.9 UT/500ML-% IV SOLN
INTRAVENOUS | Status: DC | PRN
  Administered 2022-08-20 (×2): 500 mL

## 2022-08-20 MED ORDER — LIDOCAINE HCL (PF) 1 % IJ SOLN
INTRAMUSCULAR | Status: DC | PRN
  Administered 2022-08-20: 20 mL
  Administered 2022-08-20: 2 mL

## 2022-08-20 MED ORDER — SODIUM CHLORIDE 0.9 % WEIGHT BASED INFUSION
3.0000 mL/kg/h | INTRAVENOUS | Status: AC
  Administered 2022-08-20: 3 mL/kg/h via INTRAVENOUS

## 2022-08-20 MED ORDER — VERAPAMIL HCL 2.5 MG/ML IV SOLN
INTRAVENOUS | Status: DC | PRN
  Administered 2022-08-20: 2.5 mg via INTRA_ARTERIAL

## 2022-08-20 MED ORDER — IOHEXOL 300 MG/ML  SOLN
INTRAMUSCULAR | Status: DC | PRN
  Administered 2022-08-20: 185 mL

## 2022-08-20 MED ORDER — LABETALOL HCL 5 MG/ML IV SOLN: 10.0000 mg | INTRAVENOUS | Status: DC | PRN

## 2022-08-20 MED ORDER — SODIUM CHLORIDE 0.9% FLUSH
3.0000 mL | Freq: Two times a day (BID) | INTRAVENOUS | Status: DC
  Administered 2022-08-20: 3 mL via INTRAVENOUS

## 2022-08-20 MED ORDER — ASPIRIN 81 MG PO CHEW: 81.0000 mg | CHEWABLE_TABLET | ORAL | Status: AC

## 2022-08-20 MED ORDER — FENTANYL CITRATE (PF) 100 MCG/2ML IJ SOLN
INTRAMUSCULAR | Status: AC
  Filled 2022-08-20: qty 2

## 2022-08-20 MED ORDER — HEPARIN SODIUM (PORCINE) 1000 UNIT/ML IJ SOLN
INTRAMUSCULAR | Status: AC
  Filled 2022-08-20: qty 10

## 2022-08-20 MED ORDER — HYDRALAZINE HCL 20 MG/ML IJ SOLN: 10.0000 mg | INTRAMUSCULAR | Status: DC | PRN

## 2022-08-20 MED ORDER — VERAPAMIL HCL 2.5 MG/ML IV SOLN
INTRAVENOUS | Status: AC
  Filled 2022-08-20: qty 2

## 2022-08-20 MED ORDER — SODIUM CHLORIDE 0.9% FLUSH: 3.0000 mL | INTRAVENOUS | Status: DC | PRN

## 2022-08-20 MED ORDER — MIDAZOLAM HCL 2 MG/2ML IJ SOLN
INTRAMUSCULAR | Status: DC | PRN
  Administered 2022-08-20: 1 mg via INTRAVENOUS

## 2022-08-20 MED ORDER — ONDANSETRON HCL 4 MG/2ML IJ SOLN: 4.0000 mg | Freq: Four times a day (QID) | INTRAMUSCULAR | Status: DC | PRN

## 2022-08-20 MED ORDER — SODIUM CHLORIDE 0.9 % IV SOLN
INTRAVENOUS | Status: DC | PRN
  Administered 2022-08-20: 200 mL via INTRAVENOUS

## 2022-08-20 MED ORDER — HYDRALAZINE HCL 20 MG/ML IJ SOLN
INTRAMUSCULAR | Status: DC | PRN
  Administered 2022-08-20: 10 mg via INTRAVENOUS

## 2022-08-20 MED ORDER — MIDAZOLAM HCL 2 MG/2ML IJ SOLN
INTRAMUSCULAR | Status: AC
  Filled 2022-08-20: qty 2

## 2022-08-20 MED ORDER — SODIUM CHLORIDE 0.9% FLUSH: 3.0000 mL | Freq: Two times a day (BID) | INTRAVENOUS | Status: DC

## 2022-08-20 SURGICAL SUPPLY — 17 items
CATH 5FR JL3.5 JR4 ANG PIG MP (CATHETERS) ×1 IMPLANT
CATH INFINITI 5 FR IM (CATHETERS) ×1 IMPLANT
CATH INFINITI 5 FR MPA2 (CATHETERS) ×1 IMPLANT
DEVICE CLOSURE MYNXGRIP 5F (Vascular Products) ×1 IMPLANT
DEVICE RAD TR BAND REGULAR (VASCULAR PRODUCTS) ×1 IMPLANT
DRAPE BRACHIAL (DRAPES) ×1 IMPLANT
GLIDESHEATH SLEND SS 6F .021 (SHEATH) ×1 IMPLANT
GUIDEWIRE INQWIRE 1.5J.035X260 (WIRE) ×1 IMPLANT
INQWIRE 1.5J .035X260CM (WIRE) ×2
NDL PERC 18GX7CM (NEEDLE) IMPLANT
NEEDLE PERC 18GX7CM (NEEDLE) ×2 IMPLANT
PACK CARDIAC CATH (CUSTOM PROCEDURE TRAY) ×2 IMPLANT
PROTECTION STATION PRESSURIZED (MISCELLANEOUS) ×2
SET ATX-X65L (MISCELLANEOUS) ×1 IMPLANT
SHEATH AVANTI 5FR X 11CM (SHEATH) ×1 IMPLANT
STATION PROTECTION PRESSURIZED (MISCELLANEOUS) ×1 IMPLANT
WIRE GUIDERIGHT .035X150 (WIRE) ×1 IMPLANT

## 2022-08-21 ENCOUNTER — Encounter: Payer: Self-pay | Admitting: Internal Medicine

## 2022-08-21 LAB — CARDIAC CATHETERIZATION: Cath EF Quantitative: 60 %

## 2023-01-07 ENCOUNTER — Other Ambulatory Visit: Payer: Self-pay | Admitting: Family Medicine

## 2023-01-07 DIAGNOSIS — Z1231 Encounter for screening mammogram for malignant neoplasm of breast: Secondary | ICD-10-CM

## 2023-03-06 ENCOUNTER — Ambulatory Visit
Admission: RE | Admit: 2023-03-06 | Discharge: 2023-03-06 | Disposition: A | Discharge status: Home / Self Care | Payer: Medicare PPO | Source: Ambulatory Visit | Attending: Family Medicine | Admitting: Family Medicine

## 2023-03-06 DIAGNOSIS — Z1231 Encounter for screening mammogram for malignant neoplasm of breast: Secondary | ICD-10-CM | POA: Insufficient documentation

## 2023-05-14 ENCOUNTER — Encounter: Payer: Self-pay | Admitting: Emergency Medicine

## 2023-05-14 ENCOUNTER — Emergency Department: Payer: Medicare PPO

## 2023-05-14 ENCOUNTER — Emergency Department
Admission: EM | Admit: 2023-05-14 | Discharge: 2023-05-15 | Disposition: A | Discharge status: Home / Self Care | Payer: Medicare PPO | Attending: Emergency Medicine | Admitting: Emergency Medicine

## 2023-05-14 ENCOUNTER — Other Ambulatory Visit: Payer: Self-pay

## 2023-05-14 DIAGNOSIS — M5412 Radiculopathy, cervical region: Secondary | ICD-10-CM | POA: Insufficient documentation

## 2023-05-14 DIAGNOSIS — M4802 Spinal stenosis, cervical region: Secondary | ICD-10-CM | POA: Insufficient documentation

## 2023-05-14 DIAGNOSIS — R200 Anesthesia of skin: Secondary | ICD-10-CM | POA: Diagnosis present

## 2023-05-14 DIAGNOSIS — M5410 Radiculopathy, site unspecified: Secondary | ICD-10-CM

## 2023-05-14 LAB — BASIC METABOLIC PANEL
Anion gap: 9 (ref 5–15)
BUN: 21 mg/dL (ref 8–23)
CO2: 24 mmol/L (ref 22–32)
Calcium: 10.3 mg/dL (ref 8.9–10.3)
Chloride: 107 mmol/L (ref 98–111)
Creatinine, Ser: 1.42 mg/dL — ABNORMAL HIGH (ref 0.44–1.00)
GFR, Estimated: 40 mL/min — ABNORMAL LOW (ref >60)
Glucose, Bld: 113 mg/dL — ABNORMAL HIGH (ref 70–99)
Potassium: 3.5 mmol/L (ref 3.5–5.1)
Sodium: 140 mmol/L (ref 135–145)

## 2023-05-14 LAB — CBC WITH DIFFERENTIAL/PLATELET
Abs Immature Granulocytes: 0.01 10*3/uL (ref 0.00–0.07)
Basophils Absolute: 0 10*3/uL (ref 0.0–0.1)
Basophils Relative: 1 %
Eosinophils Absolute: 0.3 10*3/uL (ref 0.0–0.5)
Eosinophils Relative: 5 %
HCT: 35.8 % — ABNORMAL LOW (ref 36.0–46.0)
Hemoglobin: 11.2 g/dL — ABNORMAL LOW (ref 12.0–15.0)
Immature Granulocytes: 0 %
Lymphocytes Relative: 18 %
Lymphs Abs: 1.1 10*3/uL (ref 0.7–4.0)
MCH: 29.9 pg (ref 26.0–34.0)
MCHC: 31.3 g/dL (ref 30.0–36.0)
MCV: 95.7 fL (ref 80.0–100.0)
Monocytes Absolute: 0.5 10*3/uL (ref 0.1–1.0)
Monocytes Relative: 8 %
Neutro Abs: 4.4 10*3/uL (ref 1.7–7.7)
Neutrophils Relative %: 68 %
Platelets: 140 10*3/uL — ABNORMAL LOW (ref 150–400)
RBC: 3.74 MIL/uL — ABNORMAL LOW (ref 3.87–5.11)
RDW: 12.7 % (ref 11.5–15.5)
WBC: 6.3 10*3/uL (ref 4.0–10.5)
nRBC: 0 % (ref 0.0–0.2)

## 2023-05-14 MED ORDER — DEXAMETHASONE SODIUM PHOSPHATE 10 MG/ML IJ SOLN
10.0000 mg | Freq: Once | INTRAMUSCULAR | Status: AC
  Administered 2023-05-14: 10 mg via INTRAVENOUS
  Filled 2023-05-14: qty 1

## 2023-05-14 NOTE — ED Provider Triage Note (Signed)
Emergency Medicine Provider Triage Evaluation Note  Molly Mosley , a 73 y.o. female  was evaluated in triage.  Pt complains of left arm pain and left sided neck pain since Monday. No history of injury. No weakness in arm or hand.  Review of Systems  Positive: Neck pain, arm pain Negative: Chest pain  Physical Exam  There were no vitals taken for this visit. Gen:   Awake, no distress   Resp:  Normal effort  MSK:   Moves extremities without difficulty  Other:    Medical Decision Making  Medically screening exam initiated at 12:13 PM.  Appropriate orders placed.  Molly Mosley was informed that the remainder of the evaluation will be completed by another provider, this initial triage assessment does not replace that evaluation, and the importance of remaining in the ED until their evaluation is complete.     Jackelyn Hoehn, PA-C 05/14/23 1220

## 2023-05-14 NOTE — ED Provider Notes (Signed)
 [facility] Provider Note    Event Date/Time   First MD Initiated Contact with Patient 05/14/23 1648     (approximate)   History   Shoulder Pain   HPI  Molly Mosley is a 73 y.o. female who presents to the emergency department today because of concerns for left upper extremity pain and numbness.  Patient states she has had the symptoms on and off for a while however they usually resolve within a day.  However this episode has been going on for 3 days.  Pain starts in her shoulder and goes down her arm.  She is also having some pain in her neck.     Physical Exam   Triage Vital Signs: ED Triage Vitals  Encounter Vitals Group     BP 05/14/23 1214 (!) 155/85     Systolic BP Percentile --      Diastolic BP Percentile --      Pulse Rate 05/14/23 1214 60     Resp 05/14/23 1214 18     Temp 05/14/23 1214 98.4 F (36.9 C)     Temp Source 05/14/23 1214 Oral     SpO2 05/14/23 1214 98 %     Weight 05/14/23 1213 145 lb (65.8 kg)     Height 05/14/23 1213 5\' 6"  (1.676 m)     Head Circumference --      Peak Flow --      Pain Score 05/14/23 1213 9     Pain Loc --      Pain Education --      Exclude from Growth Chart --     Most recent vital signs: Vitals:   05/14/23 1214  BP: (!) 155/85  Pulse: 60  Resp: 18  Temp: 98.4 F (36.9 C)  SpO2: 98%   General: Awake, alert, oriented. CV:  Good peripheral perfusion. Regular rate and rhythm. Resp:  Normal effort. Lungs clear. Abd:  No distention.  Other:  Strength 5/5 in bilateral upper extremities. Radial pulse 2+ in bilateral upper extremities.    ED Results / Procedures / Treatments   Labs (all labs ordered are listed, but only abnormal results are displayed) Labs Reviewed  BASIC METABOLIC PANEL - Abnormal; Notable for the following components:      Result Value   Glucose, Bld 113 (*)    Creatinine, Ser 1.42 (*)    GFR, Estimated 40 (*)    All other components within normal limits  CBC WITH  DIFFERENTIAL/PLATELET - Abnormal; Notable for the following components:   RBC 3.74 (*)    Hemoglobin 11.2 (*)    HCT 35.8 (*)    Platelets 140 (*)    All other components within normal limits     EKG  None   RADIOLOGY I independently interpreted and visualized the CT cervical spine. My interpretation: No pneumonia Radiology interpretation:  IMPRESSION:  1. Focal severe left eccentric degenerative changes at C6-C7 with  likely severe left foraminal stenosis at this level. An MRI of the  cervical spine could confirm and further characterize if clinically  warranted.  2. No evidence of acute fracture or traumatic malalignment.  3. Approximately 2.3 cm right thyroid nodule. (Ref: J Am Coll  Radiol. 2015 Feb;12(2): 143-50).   I independently interpreted and visualized the MR cervical spine. My interpretation: Severe stenosis Radiology interpretation:  IMPRESSION:  1. Severe spinal canal stenosis and severe bilateral neural  foraminal stenosis at C6-7 due to large disc osteophyte complex.  2. Hyperintense  T2-weighted signal within the spinal cord at the  C6-7 level, which could indicate edema or myelomalacia.  3. Severe right C4-5 neural foraminal stenosis.      PROCEDURES:  Critical Care performed: No    MEDICATIONS ORDERED IN ED: Medications - No data to display   IMPRESSION / MDM / ASSESSMENT AND PLAN / ED COURSE  I reviewed the triage vital signs and the nursing notes.                              Differential diagnosis includes, but is not limited to, radiculopathy, arthritis, vascular disease  Patient's presentation is most consistent with acute presentation with potential threat to life or bodily function.  Patient presented to the emergency department today because of concerns for left upper extremity pain and numbness.  CT cervical spine performed from triage is concerning for degenerative changes at C6-7, MRI was recommended.  MRI was obtained which shows  severe spinal canal stenosis.  Does raise concern for possible edema.  Patient will be given Decadron .  Will have neurosurgery review imaging. Discussed findings with patient.       FINAL CLINICAL IMPRESSION(S) / ED DIAGNOSES   Final diagnoses:  Cervical stenosis of spinal canal  Radiculopathy, unspecified spinal region      Note:  This document was prepared using Dragon voice recognition software and may include unintentional dictation errors.    Marylynn Soho, MD 05/14/23 2312

## 2023-05-14 NOTE — ED Notes (Signed)
 Pt has spoken to MRI and is changing into a gown.

## 2023-05-14 NOTE — ED Triage Notes (Signed)
 Patient to ED via POV for left shoulder pain/numbness that radiates into neck. Intermittent since Monday. Took tylenol  at home with no relief. C/o of HTN- takes meds as prescribed.

## 2023-05-15 MED ORDER — PREDNISONE 10 MG PO TABS: ORAL_TABLET | ORAL | 0 refills | Status: AC

## 2023-05-15 NOTE — Discharge Instructions (Signed)
I have sent a steroid taper to your pharmacy for you to take as prescribed.  Please follow-up with neurosurgery when they call you tomorrow for your appointment.  Please return for any worsening symptoms.

## 2023-05-15 NOTE — ED Provider Notes (Signed)
  Physical Exam  BP 139/66   Pulse 68   Temp 98 F (36.7 C) (Oral)   Resp 16   Ht 5\' 6"  (1.676 m)   Wt 65.8 kg   SpO2 98%   BMI 23.40 kg/m   Physical Exam  Procedures  Procedures  ED Course / MDM   Clinical Course as of 05/15/23 0126  Thu May 15, 2023  0052 NSGY - Can give steroid taper and NSGY will get her into clinic and try to get her in tomorrow. If unsteady on feet - can admit and NSGY will intervene [DW]    Clinical Course User Index [DW] Janith Lima, MD   Medical Decision Making Amount and/or Complexity of Data Reviewed Radiology: ordered.  Risk Prescription drug management.   Received patient in signout.  73 year old female presenting today for pain and numbness in her left upper extremity.  Symptoms on and off for the past several days.  No other pain symptoms or weakness elsewhere.  No difficulty walking.  No vision changes, speech changes, or facial droop.  Patient had CT and MRI of the cervical spine showing concerns for severe spinal canal stenosis with possible edema.  Patient was given Decadron.  Neurosurgery consulted for further evaluation.  Patient signed out pending neurosurgery reassessment.  Neurosurgery evaluated imaging.  They stated if she simply just had intermittent numbness and pain in the extremity, she can be discharged on a steroid taper and follow-up in clinic tomorrow.  If she was unsteady on her feet then she could be admitted for sooner intervention.  Patient stated that she had no difficulty walking and was fine ambulating here in the ED.  Will discharge her on a steroid taper and have her follow-up in the neurosurgery clinic tomorrow.  Patient was agreeable with this plan and given strict return precautions.     Janith Lima, MD 05/15/23 [phone]

## 2023-05-16 NOTE — Progress Notes (Unsigned)
Referring Physician:  No referring provider defined for this encounter.  Primary Physician:  Abram Sander, MD  History of Present Illness: 05/20/2023 Molly Mosley is here today with a chief complaint of L arm pain that prompted ER visit last week.  She started a steroid taper which has helped substantially.  She is in no pain or discomfort currently.  She denies symptoms of cervical myelopathy.  She has no balance issues.  She has no grip strength weakness.  She denies any hand numbness.   Conservative measures:  Physical therapy: has not participated in PT  Multimodal medical therapy including regular antiinflammatories: Prednisone  Injections: no epidural steroid injections  Past Surgery: none  Molly Mosley has no symptoms of cervical myelopathy.  I have utilized the care everywhere function in epic to review the outside records available from external health systems.  Review of Systems:  A 10 point review of systems is negative, except for the pertinent positives and negatives detailed in the HPI.  Past Medical History: Past Medical History:  Diagnosis Date   Breast cancer (HCC) 2011   Right- radiation   CAD (coronary artery disease)    Diabetes mellitus without complication (HCC)    HTN (hypertension)    Hyperlipidemia    MI (myocardial infarction) (HCC)    Personal history of radiation therapy 2011   Right breast    Past Surgical History: Past Surgical History:  Procedure Laterality Date   ABDOMINAL HYSTERECTOMY     BREAST BIOPSY Right 2011   Positive   BREAST LUMPECTOMY Right    COLONOSCOPY WITH PROPOFOL N/A 01/16/2021   Procedure: COLONOSCOPY WITH PROPOFOL;  Surgeon: Regis Bill, MD;  Location: [facility] ENDOSCOPY;  Service: Endoscopy;  Laterality: N/A;  DM   CORONARY ARTERY BYPASS GRAFT     x5 vessels   LEFT HEART CATH AND CORS/GRAFTS ANGIOGRAPHY N/A 08/20/2022   Procedure: LEFT HEART CATH AND CORS/GRAFTS ANGIOGRAPHY;  Surgeon: Alwyn Pea, MD;  Location: [facility] INVASIVE CV LAB;  Service: Cardiovascular;  Laterality: N/A;    Allergies: Allergies as of 05/20/2023 - Review Complete 05/20/2023  Allergen Reaction Noted   Tamiflu [oseltamivir phosphate] Palpitations and Other (See Comments) 07/11/2016    Medications:  Current Outpatient Medications:    amLODipine (NORVASC) 5 MG tablet, Take 5 mg by mouth daily. , Disp: , Rfl:    aspirin 81 MG chewable tablet, Chew by mouth., Disp: , Rfl:    cholecalciferol (VITAMIN D3) 25 MCG (1000 UNIT) tablet, Take 1,000 Units by mouth daily., Disp: , Rfl:    isosorbide mononitrate (IMDUR) 30 MG 24 hr tablet, Take 30 mg by mouth daily., Disp: , Rfl:    metFORMIN (GLUCOPHAGE) 1000 MG tablet, Take by mouth., Disp: , Rfl:    predniSONE (DELTASONE) 10 MG tablet, Take 4 tablets (40 mg total) by mouth daily for 4 days, THEN 2 tablets (20 mg total) daily for 4 days., Disp: 24 tablet, Rfl: 0   rosuvastatin (CRESTOR) 40 MG tablet, Take 40 mg by mouth daily. , Disp: , Rfl:   Social History: Social History   Tobacco Use   Smoking status: Former    Current packs/day: 0.00    Types: Cigarettes    Quit date: 04/30/2011    Years since quitting: 12.0   Smokeless tobacco: Never  Vaping Use   Vaping status: Never Used  Substance Use Topics   Alcohol use: No    Alcohol/week: 0.0 standard drinks of alcohol   Drug use:  No    Family Medical History: Family History  Problem Relation Age of Onset   Diabetes Mother    Hypertension Father    Breast cancer Neg Hx     Physical Examination: Vitals:   05/20/23 0831  BP: (!) 142/88    General: Patient is in no apparent distress. Attention to examination is appropriate.  Neck:   Supple.  Full range of motion.  Respiratory: Patient is breathing without any difficulty.   NEUROLOGICAL:     Awake, alert, oriented to person, place, and time.  Speech is clear and fluent.   Cranial Nerves: Pupils equal round and reactive to light.  Facial tone is  symmetric.  Facial sensation is symmetric. Shoulder shrug is symmetric. Tongue protrusion is midline.  There is no pronator drift.  Strength: Side Biceps Triceps Deltoid Interossei Grip Wrist Ext. Wrist Flex.  R 5 5 5 5 5 5 5   L 5 5 5 5 5 5 5    Side Iliopsoas Quads Hamstring PF DF EHL  R 5 5 5 5 5 5   L 5 5 5 5 5 5    Reflexes are 1+ and symmetric at the biceps, triceps, brachioradialis, patella and achilles.   Hoffman's is absent.   Bilateral upper and lower extremity sensation is intact to light touch.    No evidence of dysmetria noted.  Gait is normal.     Medical Decision Making  Imaging: MRI C spine 05/14/2023 IMPRESSION: 1. Severe spinal canal stenosis and severe bilateral neural foraminal stenosis at C6-7 due to large disc osteophyte complex. 2. Hyperintense T2-weighted signal within the spinal cord at the C6-7 level, which could indicate edema or myelomalacia. 3. Severe right C4-5 neural foraminal stenosis.     Electronically Signed   By: Deatra Robinson M.D.   On: 05/14/2023 22:38  I have personally reviewed the images and agree with the above interpretation.  Assessment and Plan: Molly Mosley is a pleasant 73 y.o. female with severe cervical stenosis with myelomalacia.  She is currently asymptomatic.  Steroids have helped her substantially.  At this point, I have not recommended surgical intervention as she has no symptoms.  If she develops any symptoms, we will discuss C6-7 anterior cervical discectomy and fusion.  I discussed the warning symptoms for cervical myelopathy including grip weakness, numbness and tingling in her hands, pain in her arms, dexterity changes, and balance issues.  She expressed understanding.  I will see her back in 3 months to check on her symptoms.  If she has development of any new symptoms, I have asked her to contact the office and we will see her sooner.  I spent a total of 30 minutes in this patient's care today. This time was spent  reviewing pertinent records including imaging studies, obtaining and confirming history, performing a directed evaluation, formulating and discussing my recommendations, and documenting the visit within the medical record.      Thank you for involving me in the care of this patient.      Laddie Naeem K. Myer Haff MD, [facility] Neurosurgery

## 2023-05-20 ENCOUNTER — Encounter: Payer: Self-pay | Admitting: Neurosurgery

## 2023-05-20 ENCOUNTER — Ambulatory Visit: Payer: Medicare PPO | Admitting: Neurosurgery

## 2023-05-20 VITALS — BP 142/88 | Ht 66.0 in | Wt 145.0 lb

## 2023-05-20 DIAGNOSIS — G9589 Other specified diseases of spinal cord: Secondary | ICD-10-CM | POA: Diagnosis not present

## 2023-05-20 DIAGNOSIS — M4802 Spinal stenosis, cervical region: Secondary | ICD-10-CM

## 2023-08-19 ENCOUNTER — Ambulatory Visit: Payer: Medicare PPO | Admitting: Neurosurgery

## 2023-09-04 ENCOUNTER — Ambulatory Visit (INDEPENDENT_AMBULATORY_CARE_PROVIDER_SITE_OTHER): Admitting: Neurosurgery

## 2023-09-04 ENCOUNTER — Encounter: Payer: Self-pay | Admitting: Neurosurgery

## 2023-09-04 VITALS — BP 146/80 | Ht 66.0 in | Wt 142.6 lb

## 2023-09-04 DIAGNOSIS — M4802 Spinal stenosis, cervical region: Secondary | ICD-10-CM | POA: Diagnosis not present

## 2023-09-04 DIAGNOSIS — G9589 Other specified diseases of spinal cord: Secondary | ICD-10-CM

## 2023-09-04 NOTE — Progress Notes (Signed)
 Referring Physician:  Patrina Boos, MD [address] [place],  [place] [address]  Primary Physician:  Patrina Boos, MD  History of Present Illness: 09/04/2023 Molly Mosley is stable and has no new symptoms.  05/20/2023 Molly Mosley is here today with a chief complaint of L arm pain that prompted ER visit last week.  She started a steroid taper which has helped substantially.  She is in no pain or discomfort currently.  She denies symptoms of cervical myelopathy.  She has no balance issues.  She has no grip strength weakness.  She denies any hand numbness.   Conservative measures:  Physical therapy: has not participated in PT  Multimodal medical therapy including regular antiinflammatories: Prednisone   Injections: no epidural steroid injections  Past Surgery: none  Molly Mosley has no symptoms of cervical myelopathy.  I have utilized the care everywhere function in epic to review the outside records available from external health systems.  Review of Systems:  A 10 point review of systems is negative, except for the pertinent positives and negatives detailed in the HPI.  Past Medical History: Past Medical History:  Diagnosis Date   Breast cancer (HCC) 2011   Right- radiation   CAD (coronary artery disease)    Diabetes mellitus without complication (HCC)    HTN (hypertension)    Hyperlipidemia    MI (myocardial infarction) (HCC)    Personal history of radiation therapy 2011   Right breast    Past Surgical History: Past Surgical History:  Procedure Laterality Date   ABDOMINAL HYSTERECTOMY     BREAST BIOPSY Right 2011   Positive   BREAST LUMPECTOMY Right    COLONOSCOPY WITH PROPOFOL  N/A 01/16/2021   Procedure: COLONOSCOPY WITH PROPOFOL ;  Surgeon: Shane Darling, MD;  Location: [facility] ENDOSCOPY;  Service: Endoscopy;  Laterality: N/A;  DM   CORONARY ARTERY BYPASS GRAFT     x5 vessels   LEFT HEART CATH AND CORS/GRAFTS ANGIOGRAPHY N/A 08/20/2022   Procedure:  LEFT HEART CATH AND CORS/GRAFTS ANGIOGRAPHY;  Surgeon: Antonette Batters, MD;  Location: [facility] INVASIVE CV LAB;  Service: Cardiovascular;  Laterality: N/A;    Allergies: Allergies as of 09/04/2023 - Review Complete 09/04/2023  Allergen Reaction Noted   Tamiflu [oseltamivir phosphate] Palpitations and Other (See Comments) 07/11/2016    Medications:  Current Outpatient Medications:    amLODipine (NORVASC) 5 MG tablet, Take 5 mg by mouth daily. , Disp: , Rfl:    aspirin  81 MG chewable tablet, Chew by mouth., Disp: , Rfl:    cholecalciferol (VITAMIN D3) 25 MCG (1000 UNIT) tablet, Take 1,000 Units by mouth daily., Disp: , Rfl:    isosorbide mononitrate (IMDUR) 30 MG 24 hr tablet, Take 30 mg by mouth daily., Disp: , Rfl:    rosuvastatin (CRESTOR) 40 MG tablet, Take 40 mg by mouth daily. , Disp: , Rfl:   Social History: Social History   Tobacco Use   Smoking status: Former    Current packs/day: 0.00    Types: Cigarettes    Quit date: 04/30/2011    Years since quitting: 12.3   Smokeless tobacco: Never  Vaping Use   Vaping status: Never Used  Substance Use Topics   Alcohol use: No    Alcohol/week: 0.0 standard drinks of alcohol   Drug use: No    Family Medical History: Family History  Problem Relation Age of Onset   Diabetes Mother    Hypertension Father    Breast cancer Neg Hx  Physical Examination: Vitals:   09/04/23 0948  BP: (!) 146/80    General: Patient is in no apparent distress. Attention to examination is appropriate.  Neck:   Supple.  Full range of motion.  Respiratory: Patient is breathing without any difficulty.   NEUROLOGICAL:     Awake, alert, oriented to person, place, and time.  Speech is clear and fluent.   Cranial Nerves: Pupils equal round and reactive to light.  Facial tone is symmetric.  Facial sensation is symmetric. Shoulder shrug is symmetric. Tongue protrusion is midline.  There is no pronator drift.  Strength: Side Biceps Triceps  Deltoid Interossei Grip Wrist Ext. Wrist Flex.  R 5 5 5 5 5 5 5   L 5 5 5 5 5 5 5    Side Iliopsoas Quads Hamstring PF DF EHL  R 5 5 5 5 5 5   L 5 5 5 5 5 5    Reflexes are 1+ and symmetric at the biceps, triceps, brachioradialis, patella and achilles.   Hoffman's is absent.   Bilateral upper and lower extremity sensation is intact to light touch.    No evidence of dysmetria noted.  Gait is normal.     Medical Decision Making  Imaging: MRI C spine 05/14/2023 IMPRESSION: 1. Severe spinal canal stenosis and severe bilateral neural foraminal stenosis at C6-7 due to large disc osteophyte complex. 2. Hyperintense T2-weighted signal within the spinal cord at the C6-7 level, which could indicate edema or myelomalacia. 3. Severe right C4-5 neural foraminal stenosis.     Electronically Signed   By: Juanetta Nordmann M.D.   On: 05/14/2023 22:38  I have personally reviewed the images and agree with the above interpretation.  Assessment and Plan: Molly Mosley is a pleasant 73 y.o. female with severe cervical stenosis with myelomalacia.  She is currently asymptomatic.    We discussed warning signs.  At this point, I have not recommended surgical intervention as she has no symptoms.  If she develops any symptoms, we will discuss C6-7 anterior cervical discectomy and fusion.  I discussed the warning symptoms for cervical myelopathy including grip weakness, numbness and tingling in her hands, pain in her arms, dexterity changes, and balance issues.  She expressed understanding.      I will see her back as needed if she develops any symptoms.    I spent a total of 10 minutes in this patient's care today. This time was spent reviewing pertinent records including imaging studies, obtaining and confirming history, performing a directed evaluation, formulating and discussing my recommendations, and documenting the visit within the medical record.      Thank you for involving me in the care of this  patient.      Echo Allsbrook K. Mont Antis MD, [facility] Neurosurgery

## 2023-11-06 NOTE — Progress Notes (Signed)
 Established Patient Visit   Chief Complaint: Coronary artery disease Chief Complaint  Patient presents with  . Follow-up    6 months   Date of Service: 11/06/2023 Date of Birth: 16-Aug-1950 PCP: Adina Buel Jansky, MD  History of Present Illness: Ms. Brierley is a 73 y.o.female patient who presented for follow-up of coronary artery disease  Past medical history significant for CAD status post CABGX5 in 2010 (LIMA to LAD, SVG to PDA, SVG to OM1, SVG to OM 2 and SVG to ramus), hypertension, hyperlipidemia.  Due to abnormal stress test patient had last cath 07/2022 which showed occluded left main/ostial LAD/proximal left circumflex and CTO proximal RCA.  Patent grafts including LIMA to mid LAD, SVG to OM1 and OM 3, SVG to distal RCA.  Medical management continued.  Echocardiogram 06/2022 with normal biventricular systolic function, LVEF 50 to 44%, apical septal wall hypokinesis, no significant valve abnormality.  Patient details that she is doing well from cardiac standpoint.  She walks for 30 minutes 3 times a week, also spent time with grandchildren.  No complaints of chest pain/pressure, increased shortness of breath, palpitation, dizziness or syncope.  EKG today showed sinus rhythm with frequent PACs  Past Medical and Surgical History  Past Medical History Past Medical History:  Diagnosis Date  . Breast cancer, right breast (CMS/HHS-HCC)   . Coronary artery disease   . Diabetes mellitus type 2, uncomplicated (CMS/HHS-HCC)   . Hyperlipidemia   . Hypertension     Past Surgical History She has a past surgical history that includes Coronary artery bypass graft (08/27/2008); Colonoscopy (01/16/2021); and Colon @ [facility] (05/15/2022).   Medications and Allergies  Current Medications  Current Outpatient Medications  Medication Sig Dispense Refill  . amLODIPine (NORVASC) 5 MG tablet Take 1 tablet (5 mg total) by mouth once daily. 90 tablet 3  . aspirin  81 MG chewable tablet Take 81 mg by mouth once  daily    . carvediloL (COREG) 3.125 MG tablet Take 1 tablet (3.125 mg total) by mouth 2 (two) times daily with meals 180 tablet 3  . cholecalciferol (VITAMIN D3) 1000 unit tablet Take 1,000 Units by mouth once daily    . isosorbide mononitrate (IMDUR) 30 MG ER tablet Take 1 tablet (30 mg total) by mouth once daily 30 tablet 11  . lisinopriL (ZESTRIL) 40 MG tablet Take 40 mg by mouth once daily    . rosuvastatin (CRESTOR) 40 MG tablet Take 1 tablet (40 mg total) by mouth once daily 90 tablet 3   No current facility-administered medications for this visit.    Allergies: Oseltamivir phosphate  Social and Family History  Social History  reports that she has quit smoking. She has never used smokeless tobacco. She reports that she does not drink alcohol.  Family History Family History  Problem Relation Name Age of Onset  . High blood pressure (Hypertension) Mother    . Diabetes type II Mother    . High blood pressure (Hypertension) Brother      Review of Systems   Review of Systems: The patient denies chest pain, shortness of breath, orthopnea, paroxysmal nocturnal dyspnea, pedal edema, palpitations, heart racing, presyncope, syncope.   Physical Examination   Vitals:BP 126/72   Pulse 58   Ht 167.6 cm (5' 6)   Wt 64.4 kg (142 lb)   SpO2 99%   BMI 22.92 kg/m  Ht:167.6 cm (5' 6) Wt:64.4 kg (142 lb) ADJ:Anib surface area is 1.73 meters squared. Body mass index is 22.92 kg/m.  HEENT: Pupils equally reactive to light and accomodation  Neck: Supple, no significant JVD Lungs: clear to auscultation bilaterally; no wheezes, rales, rhonchi Heart: Regular rate and rhythm. No murmur Extremities: no pedal edema  Assessment and Plan   73 y.o. female with  CAD with prior CABG Hypertension Hyperlipidemia Frequent PACs  Stable from cardiac standpoint without any anginal symptoms, overall stays active.  Euvolemic on exam. Blood pressure well-controlled, continue  amlodipine/lisinopril Continue Coreg, Imdur Continue low-dose aspirin  81 mg daily Continue high intensity statin. EKG today showed sinus rhythm with frequent PACs.  She wore cardiac monitor for same reason in 08/2022 which showed sinus rhythm with 4% PACs.  No atrial fibrillation. She is not symptomatic from PACs, continue Coreg Mediterranean diet and regular activity  Orders Placed This Encounter  Procedures  . ECG 12-lead  . ECG 12-lead    Return in about 6 months (around 05/08/2024).  KRISHNA CHAITANYA ALLURI, MD  This dictation was prepared with dragon dictation. Any transcription errors that result from this process are unintentional.

## 2023-12-22 ENCOUNTER — Other Ambulatory Visit: Payer: Self-pay

## 2023-12-22 ENCOUNTER — Emergency Department
Admission: EM | Admit: 2023-12-22 | Discharge: 2023-12-22 | Disposition: A | Discharge status: Home / Self Care | Attending: Emergency Medicine | Admitting: Emergency Medicine

## 2023-12-22 ENCOUNTER — Encounter: Payer: Self-pay | Admitting: Emergency Medicine

## 2023-12-22 ENCOUNTER — Emergency Department

## 2023-12-22 DIAGNOSIS — R42 Dizziness and giddiness: Secondary | ICD-10-CM | POA: Diagnosis present

## 2023-12-22 DIAGNOSIS — I10 Essential (primary) hypertension: Secondary | ICD-10-CM | POA: Diagnosis not present

## 2023-12-22 LAB — BASIC METABOLIC PANEL WITH GFR
Anion gap: 10 (ref 5–15)
BUN: 27 mg/dL — ABNORMAL HIGH (ref 8–23)
CO2: 26 mmol/L (ref 22–32)
Calcium: 11.1 mg/dL — ABNORMAL HIGH (ref 8.9–10.3)
Chloride: 105 mmol/L (ref 98–111)
Creatinine, Ser: 1.2 mg/dL — ABNORMAL HIGH (ref 0.44–1.00)
GFR, Estimated: 48 mL/min — ABNORMAL LOW (ref >60)
Glucose, Bld: 136 mg/dL — ABNORMAL HIGH (ref 70–99)
Potassium: 3.7 mmol/L (ref 3.5–5.1)
Sodium: 141 mmol/L (ref 135–145)

## 2023-12-22 LAB — CBC
HCT: 36.5 % (ref 36.0–46.0)
Hemoglobin: 11.7 g/dL — ABNORMAL LOW (ref 12.0–15.0)
MCH: 30.2 pg (ref 26.0–34.0)
MCHC: 32.1 g/dL (ref 30.0–36.0)
MCV: 94.1 fL (ref 80.0–100.0)
Platelets: 138 K/uL — ABNORMAL LOW (ref 150–400)
RBC: 3.88 MIL/uL (ref 3.87–5.11)
RDW: 12.7 % (ref 11.5–15.5)
WBC: 5 K/uL (ref 4.0–10.5)
nRBC: 0 % (ref 0.0–0.2)

## 2023-12-22 LAB — TROPONIN I (HIGH SENSITIVITY): Troponin I (High Sensitivity): 14 ng/L (ref <18)

## 2023-12-22 NOTE — ED Provider Notes (Signed)
 [facility] Provider Note    Event Date/Time   First MD Initiated Contact with Patient 12/22/23 1244     (approximate)   History   No chief complaint on file.   HPI  Molly Mosley is a 73 y.o. female past medical history significant for hypertension, who presents to the emergency department following an episode of dizziness with high blood pressure.  States that she went to her blood pressure check today with her primary care physician and that when she got home she had an episode where she did not feel right.  States that she had not eaten anything prior to her lab work today.  States that she just all of a sudden did not feel well and felt lightheaded and dizzy and checked her blood pressure and it was elevated.  States that this lasted for a couple of minutes and then resolved.  States that she feels fine at this time.  Denies headache.  Denies change in vision, slurring of speech or extremity numbness or weakness.  Denies any room spinning or feeling off balance.  Denies any chest pain or shortness of breath.     Physical Exam   Triage Vital Signs: ED Triage Vitals  Encounter Vitals Group     BP 12/22/23 1202 (!) 158/82     Girls Systolic BP Percentile --      Girls Diastolic BP Percentile --      Boys Systolic BP Percentile --      Boys Diastolic BP Percentile --      Pulse Rate 12/22/23 1202 69     Resp 12/22/23 1202 16     Temp 12/22/23 1202 98.1 F (36.7 C)     Temp Source 12/22/23 1202 Oral     SpO2 12/22/23 1202 98 %     Weight 12/22/23 1202 142 lb 10.2 oz (64.7 kg)     Height 12/22/23 1257 5' 6 (1.676 m)     Head Circumference --      Peak Flow --      Pain Score 12/22/23 1202 0     Pain Loc --      Pain Education --      Exclude from Growth Chart --     Most recent vital signs: Vitals:   12/22/23 1202  BP: (!) 158/82  Pulse: 69  Resp: 16  Temp: 98.1 F (36.7 C)  SpO2: 98%    Physical Exam Constitutional:      Appearance:  She is well-developed.  HENT:     Head: Atraumatic.  Eyes:     Conjunctiva/sclera: Conjunctivae normal.  Cardiovascular:     Rate and Rhythm: Regular rhythm.     Comments: Irregular rhythm Pulmonary:     Effort: No respiratory distress.  Abdominal:     General: There is no distension.     Tenderness: There is no abdominal tenderness.  Musculoskeletal:        General: Normal range of motion.     Cervical back: Normal range of motion.  Skin:    General: Skin is warm.  Neurological:     Mental Status: She is alert. Mental status is at baseline.     GCS: GCS eye subscore is 4. GCS verbal subscore is 5. GCS motor subscore is 6.     Cranial Nerves: Cranial nerves 2-12 are intact.     Sensory: Sensation is intact.     Motor: Motor function is intact.     Coordination:  Coordination is intact.     IMPRESSION / MDM / ASSESSMENT AND PLAN / ED COURSE  I reviewed the triage vital signs and the nursing notes.  Differential diagnosis including hypertensive emergency, ACS, dysrhythmia, intracranial hemorrhage  Normal neurologic exam, have a low suspicion for CVA, does not sound like she had room spinning dizziness, gait instability or change in vision, have a low suspicion for TIA  EKG  I, Clotilda Punter, the attending physician, personally viewed and interpreted this ECG.  EKG showed sinus bradycardia with a heart rate of 56.  Frequent PVCs.  Nonspecific ST changes.  No significant change when compared to prior EKG  No tachycardic or bradycardic dysrhythmias while on cardiac telemetry.  RADIOLOGY I independently reviewed imaging, my interpretation of imaging: CT scan of the head without signs of intracranial hemorrhage.  Read as no acute findings  LABS (all labs ordered are listed, but only abnormal results are displayed) Labs interpreted as -    Labs Reviewed  BASIC METABOLIC PANEL WITH GFR - Abnormal; Notable for the following components:      Result Value   Glucose, Bld 136  (*)    BUN 27 (*)    Creatinine, Ser 1.20 (*)    Calcium 11.1 (*)    GFR, Estimated 48 (*)    All other components within normal limits  CBC - Abnormal; Notable for the following components:   Hemoglobin 11.7 (*)    Platelets 138 (*)    All other components within normal limits  TROPONIN I (HIGH SENSITIVITY)     MDM  Creatinine is at her baseline.  Mildly elevated BUN at 27.  No significant leukocytosis.  Anemia but her hemoglobin is stable at 11.7.  Chronically low platelets at 138 with no signs or symptoms of a bleed.  Troponin is negative.  No active chest pain.  No ongoing symptoms.  CT scan of her head without signs of intracranial hemorrhage and continues to have a nonfocal neurologic exam in the emergency department.  Repeat blood pressure stable at 150.  Discussed return precautions for any return of symptoms and discussed close follow-up as an outpatient with primary care provider.     PROCEDURES:  Critical Care performed: No  Procedures  Patient's presentation is most consistent with acute presentation with potential threat to life or bodily function.   MEDICATIONS ORDERED IN ED: Medications - No data to display  FINAL CLINICAL IMPRESSION(S) / ED DIAGNOSES   Final diagnoses:  Hypertension, unspecified type     Rx / DC Orders   ED Discharge Orders     None        Note:  This document was prepared using Dragon voice recognition software and may include unintentional dictation errors.   Punter Clotilda, MD 12/22/23 1600

## 2023-12-22 NOTE — ED Triage Notes (Signed)
 C/O HTN today. Had appointment with PCP this morning. Went home, states while fixing breakfast, felt a little dizzy and checked BP at home and readying was 172/102.  Arrives to ED, denies dizziness, just concerned for HTN.  Denies pain.  Has taken BP meds this morning.  Takes lisinopril and Norvasc

## 2023-12-22 NOTE — ED Notes (Signed)
 See triage note  Presents with some high b/p  States she went to per PCP this am  Then when she got home she just didn't feel right  Took her b/p and it was elevated

## 2023-12-22 NOTE — Discharge Instructions (Signed)
 You were seen in the emergency department following an episode of high blood pressure and were not feeling well at that time.  You had a CT scan of your head today that did not show any signs of bleeding.  Your lab work was overall normal including a normal heart enzyme.  It is important that you follow-up closely with your primary care physician.  Stay hydrated and drink plenty of fluids.  Return to the emergency department if you have any return of symptoms or any other new symptoms including dizziness, headache, change in vision, chest pain or vomiting.

## 2024-03-30 ENCOUNTER — Other Ambulatory Visit: Payer: Self-pay | Admitting: Family Medicine

## 2024-03-30 DIAGNOSIS — Z780 Asymptomatic menopausal state: Secondary | ICD-10-CM

## 2024-03-30 DIAGNOSIS — Z1231 Encounter for screening mammogram for malignant neoplasm of breast: Secondary | ICD-10-CM

## 2024-05-12 ENCOUNTER — Other Ambulatory Visit

## 2024-05-12 ENCOUNTER — Encounter

## 2024-05-13 ENCOUNTER — Ambulatory Visit
Admission: RE | Admit: 2024-05-13 | Discharge: 2024-05-13 | Disposition: A | Discharge status: Home / Self Care | Source: Ambulatory Visit | Attending: Family Medicine | Admitting: Family Medicine

## 2024-05-13 ENCOUNTER — Ambulatory Visit
Admission: RE | Admit: 2024-05-13 | Discharge: 2024-05-13 | Disposition: A | Source: Ambulatory Visit | Attending: Family Medicine | Admitting: Family Medicine

## 2024-05-13 DIAGNOSIS — Z1231 Encounter for screening mammogram for malignant neoplasm of breast: Secondary | ICD-10-CM | POA: Insufficient documentation

## 2024-05-13 DIAGNOSIS — Z780 Asymptomatic menopausal state: Secondary | ICD-10-CM
